# Patient Record
Sex: Female | Born: 1965 | Race: Black or African American | Hispanic: No | Marital: Single | Smoking: Never smoker
Health system: Southern US, Community
[De-identification: ages and names within clinical notes are randomized; demographics above are authoritative.]

## PROBLEM LIST (undated history)

## (undated) DIAGNOSIS — E785 Hyperlipidemia, unspecified: Secondary | ICD-10-CM

## (undated) DIAGNOSIS — F329 Major depressive disorder, single episode, unspecified: Secondary | ICD-10-CM

## (undated) DIAGNOSIS — K219 Gastro-esophageal reflux disease without esophagitis: Secondary | ICD-10-CM

## (undated) DIAGNOSIS — F32A Depression, unspecified: Secondary | ICD-10-CM

## (undated) DIAGNOSIS — E119 Type 2 diabetes mellitus without complications: Secondary | ICD-10-CM

## (undated) DIAGNOSIS — F319 Bipolar disorder, unspecified: Secondary | ICD-10-CM

## (undated) DIAGNOSIS — I10 Essential (primary) hypertension: Secondary | ICD-10-CM

## (undated) DIAGNOSIS — R928 Other abnormal and inconclusive findings on diagnostic imaging of breast: Secondary | ICD-10-CM

## (undated) HISTORY — PX: BREAST BIOPSY: SHX20

## (undated) HISTORY — PX: ABDOMINAL HYSTERECTOMY: SHX81

## (undated) HISTORY — PX: CHOLECYSTECTOMY: SHX55

## (undated) HISTORY — PX: TUBAL LIGATION: SHX77

---

## 2011-06-02 ENCOUNTER — Encounter: Payer: Self-pay | Admitting: Emergency Medicine

## 2011-06-02 ENCOUNTER — Emergency Department (HOSPITAL_COMMUNITY)
Admission: EM | Admit: 2011-06-02 | Discharge: 2011-06-02 | Disposition: A | Discharge status: Home / Self Care | Payer: Medicaid Other | Attending: Emergency Medicine | Admitting: Emergency Medicine

## 2011-06-02 ENCOUNTER — Emergency Department (HOSPITAL_COMMUNITY): Payer: Medicaid Other

## 2011-06-02 DIAGNOSIS — J40 Bronchitis, not specified as acute or chronic: Secondary | ICD-10-CM | POA: Insufficient documentation

## 2011-06-02 DIAGNOSIS — R05 Cough: Secondary | ICD-10-CM | POA: Insufficient documentation

## 2011-06-02 DIAGNOSIS — R059 Cough, unspecified: Secondary | ICD-10-CM | POA: Insufficient documentation

## 2011-06-02 DIAGNOSIS — R509 Fever, unspecified: Secondary | ICD-10-CM | POA: Insufficient documentation

## 2011-06-02 DIAGNOSIS — K5289 Other specified noninfective gastroenteritis and colitis: Secondary | ICD-10-CM | POA: Insufficient documentation

## 2011-06-02 DIAGNOSIS — K529 Noninfective gastroenteritis and colitis, unspecified: Secondary | ICD-10-CM

## 2011-06-02 LAB — CBC
HCT: 39 % (ref 36.0–46.0)
Hemoglobin: 12.8 g/dL (ref 12.0–15.0)
MCHC: 32.8 g/dL (ref 30.0–36.0)
WBC: 8.2 10*3/uL (ref 4.0–10.5)

## 2011-06-02 LAB — COMPREHENSIVE METABOLIC PANEL
AST: 21 U/L (ref 0–37)
Albumin: 3.6 g/dL (ref 3.5–5.2)
Alkaline Phosphatase: 116 U/L (ref 39–117)
BUN: 10 mg/dL (ref 6–23)
CO2: 25 mEq/L (ref 19–32)
Chloride: 100 mEq/L (ref 96–112)
GFR calc non Af Amer: >90 mL/min (ref >90)
Potassium: 3.6 mEq/L (ref 3.5–5.1)
Total Bilirubin: 0.3 mg/dL (ref 0.3–1.2)

## 2011-06-02 LAB — DIFFERENTIAL
Basophils Absolute: 0 10*3/uL (ref 0.0–0.1)
Basophils Relative: 0 % (ref 0–1)
Lymphocytes Relative: 37 % (ref 12–46)
Monocytes Absolute: 0.5 10*3/uL (ref 0.1–1.0)
Monocytes Relative: 6 % (ref 3–12)
Neutro Abs: 4.5 10*3/uL (ref 1.7–7.7)
Neutrophils Relative %: 55 % (ref 43–77)

## 2011-06-02 LAB — URINALYSIS, ROUTINE W REFLEX MICROSCOPIC
Glucose, UA: NEGATIVE mg/dL
Hgb urine dipstick: NEGATIVE
Ketones, ur: NEGATIVE mg/dL
Protein, ur: NEGATIVE mg/dL
Urobilinogen, UA: 0.2 mg/dL (ref 0.0–1.0)

## 2011-06-02 MED ORDER — SODIUM CHLORIDE 0.9 % IV SOLN
Freq: Once | INTRAVENOUS | Status: AC
  Administered 2011-06-02: 14:00:00 via INTRAVENOUS

## 2011-06-02 MED ORDER — ONDANSETRON HCL 4 MG/2ML IJ SOLN
4.0000 mg | Freq: Once | INTRAMUSCULAR | Status: AC
  Administered 2011-06-02: 4 mg via INTRAVENOUS
  Filled 2011-06-02: qty 2

## 2011-06-02 MED ORDER — AZITHROMYCIN 250 MG PO TABS: 250.0000 mg | ORAL_TABLET | Freq: Every day | ORAL | Status: AC

## 2011-06-02 MED ORDER — KETOROLAC TROMETHAMINE 30 MG/ML IJ SOLN
30.0000 mg | Freq: Once | INTRAMUSCULAR | Status: AC
  Administered 2011-06-02: 30 mg via INTRAVENOUS
  Filled 2011-06-02: qty 1

## 2011-06-02 NOTE — ED Provider Notes (Signed)
History     CSN: [id number]  Arrival date & time 06/02/11  1248   First MD Initiated Contact with Patient 06/02/11 1326      Chief Complaint  Patient presents with  . Emesis  . Influenza  . Diarrhea    (Consider location/radiation/quality/duration/timing/severity/associated sxs/prior treatment) Patient is a 45 y.o. female presenting with vomiting, flu symptoms, and diarrhea. The history is provided by the patient.  Emesis  This is a new problem. The current episode started more than 1 week ago. The problem occurs continuously. The problem has been gradually worsening. The emesis has an appearance of stomach contents. The maximum temperature recorded prior to her arrival was 101 to 101.9 F. Associated symptoms include diarrhea.  Influenza  Diarrhea The primary symptoms include vomiting and diarrhea.    History reviewed. No pertinent past medical history.  Past Surgical History  Procedure Date  . Abdominal hysterectomy     No family history on file.  History  Substance Use Topics  . Smoking status: Not on file  . Smokeless tobacco: Not on file  . Alcohol Use: Yes    OB History    Grav Para Term Preterm Abortions TAB SAB Ect Mult Living                  Review of Systems  Gastrointestinal: Positive for vomiting and diarrhea.  All other systems reviewed and are negative.    Allergies  Review of patient's allergies indicates no known allergies.  Home Medications  No current outpatient prescriptions on file.  BP 93/65  Pulse 69  Temp(Src) 98.7 F (37.1 C) (Oral)  Resp 20  SpO2 99%  Physical Exam  Nursing note and vitals reviewed. Constitutional: She is oriented to person, place, and time. She appears well-developed and well-nourished. No distress.  HENT:  Head: Normocephalic and atraumatic.  Neck: Normal range of motion. Neck supple.  Cardiovascular: Normal rate and regular rhythm.  Exam reveals no gallop and no friction rub.   No murmur  heard. Pulmonary/Chest: Effort normal and breath sounds normal. No respiratory distress. She has no wheezes.  Abdominal: Soft. Bowel sounds are normal. She exhibits no distension. There is no tenderness.  Musculoskeletal: Normal range of motion.  Neurological: She is alert and oriented to person, place, and time.  Skin: Skin is warm and dry. She is not diaphoretic.    ED Course  Procedures (including critical care time)  Labs Reviewed - No data to display No results found.   No diagnosis found.    MDM  Labs, Cxr look okay.  Patient has had productive cough for one week.  Will treat with antibiotics.          Geoffery Lyons, MD 06/02/11 [phone]

## 2011-06-02 NOTE — ED Notes (Signed)
Patient verbalized understanding of discharge instruction and prescription

## 2011-06-02 NOTE — ED Notes (Signed)
Pt c/o flu s/s with n/v/d and fever off and on x1.5wks

## 2011-06-21 ENCOUNTER — Emergency Department (HOSPITAL_COMMUNITY)
Admission: EM | Admit: 2011-06-21 | Discharge: 2011-06-21 | Disposition: A | Discharge status: Home / Self Care | Payer: Medicaid Other | Attending: Emergency Medicine | Admitting: Emergency Medicine

## 2011-06-21 ENCOUNTER — Emergency Department (HOSPITAL_COMMUNITY): Payer: Medicaid Other

## 2011-06-21 ENCOUNTER — Other Ambulatory Visit: Payer: Self-pay

## 2011-06-21 ENCOUNTER — Encounter (HOSPITAL_COMMUNITY): Payer: Self-pay | Admitting: *Deleted

## 2011-06-21 DIAGNOSIS — R12 Heartburn: Secondary | ICD-10-CM | POA: Insufficient documentation

## 2011-06-21 DIAGNOSIS — R1013 Epigastric pain: Secondary | ICD-10-CM | POA: Insufficient documentation

## 2011-06-21 DIAGNOSIS — K219 Gastro-esophageal reflux disease without esophagitis: Secondary | ICD-10-CM | POA: Insufficient documentation

## 2011-06-21 DIAGNOSIS — Z79899 Other long term (current) drug therapy: Secondary | ICD-10-CM | POA: Insufficient documentation

## 2011-06-21 HISTORY — DX: Gastro-esophageal reflux disease without esophagitis: K21.9

## 2011-06-21 LAB — CBC
HCT: 38.1 % (ref 36.0–46.0)
MCH: 26.4 pg (ref 26.0–34.0)
MCHC: 33.1 g/dL (ref 30.0–36.0)
MCV: 79.9 fL (ref 78.0–100.0)
RDW: 13.6 % (ref 11.5–15.5)

## 2011-06-21 LAB — POCT I-STAT, CHEM 8
Calcium, Ion: 1.12 mmol/L (ref 1.12–1.32)
Chloride: 106 mEq/L (ref 96–112)
HCT: 38 % (ref 36.0–46.0)
Sodium: 140 mEq/L (ref 135–145)

## 2011-06-21 LAB — D-DIMER, QUANTITATIVE: D-Dimer, Quant: 1.43 ug/mL-FEU — ABNORMAL HIGH (ref 0.00–0.48)

## 2011-06-21 LAB — COMPREHENSIVE METABOLIC PANEL
Albumin: 3.4 g/dL — ABNORMAL LOW (ref 3.5–5.2)
BUN: 12 mg/dL (ref 6–23)
Calcium: 9.4 mg/dL (ref 8.4–10.5)
Creatinine, Ser: 0.7 mg/dL (ref 0.50–1.10)
Total Bilirubin: 0.2 mg/dL — ABNORMAL LOW (ref 0.3–1.2)
Total Protein: 7.9 g/dL (ref 6.0–8.3)

## 2011-06-21 LAB — POCT I-STAT TROPONIN I: Troponin i, poc: 0 ng/mL (ref 0.00–0.08)

## 2011-06-21 LAB — TROPONIN I: Troponin I: <0.3 ng/mL (ref <0.30)

## 2011-06-21 MED ORDER — FAMOTIDINE 20 MG PO TABS: 20.0000 mg | ORAL_TABLET | Freq: Two times a day (BID) | ORAL | Status: DC

## 2011-06-21 MED ORDER — FAMOTIDINE IN NACL 20-0.9 MG/50ML-% IV SOLN
20.0000 mg | Freq: Once | INTRAVENOUS | Status: AC
  Administered 2011-06-21: 20 mg via INTRAVENOUS
  Filled 2011-06-21: qty 50

## 2011-06-21 MED ORDER — GI COCKTAIL ~~LOC~~
30.0000 mL | Freq: Once | ORAL | Status: AC
  Administered 2011-06-21: 30 mL via ORAL
  Filled 2011-06-21: qty 30

## 2011-06-21 MED ORDER — IOHEXOL 300 MG/ML  SOLN
100.0000 mL | Freq: Once | INTRAMUSCULAR | Status: AC | PRN
  Administered 2011-06-21: 100 mL via INTRAVENOUS

## 2011-06-21 MED ORDER — PANTOPRAZOLE SODIUM 40 MG IV SOLR
40.0000 mg | Freq: Once | INTRAVENOUS | Status: AC
  Administered 2011-06-21: 40 mg via INTRAVENOUS
  Filled 2011-06-21: qty 40

## 2011-06-21 MED ORDER — SODIUM CHLORIDE 0.9 % IV BOLUS (SEPSIS)
1000.0000 mL | Freq: Once | INTRAVENOUS | Status: AC
  Administered 2011-06-21: 1000 mL via INTRAVENOUS

## 2011-06-21 MED ORDER — ESOMEPRAZOLE MAGNESIUM 40 MG PO CPDR: 40.0000 mg | DELAYED_RELEASE_CAPSULE | Freq: Every day | ORAL | Status: DC

## 2011-06-21 NOTE — ED Notes (Addendum)
Sunnie Nielsen, MD at bedside.

## 2011-06-21 NOTE — ED Notes (Addendum)
Per EMS- pt in c/o epigastric pain x1 day, worse after eating dinner tonight, intermittent, pt is out of nexium

## 2011-06-21 NOTE — ED Provider Notes (Signed)
History     CSN: [id number]  Arrival date & time 06/21/11  0044   First MD Initiated Contact with Patient 06/21/11 0054      Chief Complaint  Patient presents with  . Abdominal Pain    (Consider location/radiation/quality/duration/timing/severity/associated sxs/prior treatment) Patient is a 46 y.o. female presenting with abdominal pain. The history is provided by the patient.  Abdominal Pain The primary symptoms of the illness do not include fever, shortness of breath or dysuria. The current episode started 13 to 24 hours ago. The onset of the illness was gradual. The problem has not changed since onset. Associated with: Started after eating lasagna. The patient has not had a change in bowel habit. Risk factors: Has a history of GERD and ran out of her medication that she takes for the same. Additional symptoms associated with the illness include heartburn. Symptoms associated with the illness do not include chills, anorexia, diaphoresis, constipation, urgency, hematuria, frequency or back pain. Significant associated medical issues do not include liver disease or cardiac disease. Associated medical issues comments: History of Cholecystectomy.   pain is epigastric and substernal, described as burning in quality. No radiation of pain. Feels like reflux with history of same. Patient states she is never a lasagna before. She has not had any medications at home tonight. Symptoms are moderate in severity. No associated shortness of breath, diaphoresis or back pain. No leg pain or swelling. No cough or fevers.  Past Medical History  Diagnosis Date  . GERD (gastroesophageal reflux disease)     Past Surgical History  Procedure Date  . Abdominal hysterectomy     History reviewed. No pertinent family history.  History  Substance Use Topics  . Smoking status: Not on file  . Smokeless tobacco: Not on file  . Alcohol Use: Yes    OB History    Grav Para Term Preterm Abortions TAB SAB Ect  Mult Living                  Review of Systems  Constitutional: Negative for fever, chills and diaphoresis.  HENT: Negative for neck pain and neck stiffness.   Eyes: Negative for pain.  Respiratory: Negative for shortness of breath.   Cardiovascular: Negative for leg swelling.  Gastrointestinal: Positive for heartburn. Negative for constipation, blood in stool, abdominal distention and anorexia.  Genitourinary: Negative for dysuria, urgency, frequency and hematuria.  Musculoskeletal: Negative for back pain.  Skin: Negative for rash.  Neurological: Negative for headaches.  All other systems reviewed and are negative.    Allergies  Review of patient's allergies indicates no known allergies.  Home Medications   Current Outpatient Rx  Name Route Sig Dispense Refill  . DIVALPROEX SODIUM 250 MG PO TBEC Oral Take 500 mg by mouth every morning.     . MULTI-VITAMIN/MINERALS PO TABS Oral Take 1 tablet by mouth daily.      . QUETIAPINE FUMARATE 400 MG PO TABS Oral Take 400 mg by mouth at bedtime.      . TRAZODONE HCL 100 MG PO TABS Oral Take 100 mg by mouth at bedtime.      Marland Kitchen ZOLPIDEM TARTRATE 10 MG PO TABS Oral Take 10 mg by mouth at bedtime as needed. sleep       BP 101/48  Pulse 82  Temp(Src) 98 F (36.7 C) (Oral)  Resp 17  SpO2 99%  Physical Exam  Constitutional: She is oriented to person, place, and time. She appears well-developed and well-nourished.  HENT:  Head: Normocephalic and atraumatic.  Eyes: Conjunctivae and EOM are normal. Pupils are equal, round, and reactive to light.  Neck: Trachea normal. Neck supple. No thyromegaly present.  Cardiovascular: Normal rate, regular rhythm, S1 normal, S2 normal and normal pulses.     No systolic murmur is present   No diastolic murmur is present  Pulses:      Radial pulses are 2+ on the right side, and 2+ on the left side.  Pulmonary/Chest: Effort normal and breath sounds normal. She has no wheezes. She has no rhonchi. She  has no rales. She exhibits no tenderness.  Abdominal: Soft. Normal appearance and bowel sounds are normal. There is no tenderness. There is no CVA tenderness and negative Murphy's sign.  Musculoskeletal:       BLE:s Calves nontender, no cords or erythema, negative Homans sign  Neurological: She is alert and oriented to person, place, and time. She has normal strength. No cranial nerve deficit or sensory deficit. GCS eye subscore is 4. GCS verbal subscore is 5. GCS motor subscore is 6.  Skin: Skin is warm and dry. No rash noted. She is not diaphoretic.  Psychiatric: Her speech is normal.       Cooperative and appropriate    ED Course  Procedures (including critical care time)  Results for orders placed during the hospital encounter of 06/21/11  CBC      Component Value Range   WBC 9.2  4.0 - 10.5 (K/uL)   RBC 4.77  3.87 - 5.11 (MIL/uL)   Hemoglobin 12.6  12.0 - 15.0 (g/dL)   HCT 19.1  47.8 - 29.5 (%)   MCV 79.9  78.0 - 100.0 (fL)   MCH 26.4  26.0 - 34.0 (pg)   MCHC 33.1  30.0 - 36.0 (g/dL)   RDW 62.1  30.8 - 65.7 (%)   Platelets 296  150 - 400 (K/uL)  COMPREHENSIVE METABOLIC PANEL      Component Value Range   Sodium 134 (*) 135 - 145 (mEq/L)   Potassium 3.4 (*) 3.5 - 5.1 (mEq/L)   Chloride 98  96 - 112 (mEq/L)   CO2 25  19 - 32 (mEq/L)   Glucose, Bld 155 (*) 70 - 99 (mg/dL)   BUN 12  6 - 23 (mg/dL)   Creatinine, Ser 8.46  0.50 - 1.10 (mg/dL)   Calcium 9.4  8.4 - 96.2 (mg/dL)   Total Protein 7.9  6.0 - 8.3 (g/dL)   Albumin 3.4 (*) 3.5 - 5.2 (g/dL)   AST 26  0 - 37 (U/L)   ALT 42 (*) 0 - 35 (U/L)   Alkaline Phosphatase 97  39 - 117 (U/L)   Total Bilirubin 0.2 (*) 0.3 - 1.2 (mg/dL)   GFR calc non Af Amer >90  >90 (mL/min)   GFR calc Af Amer >90  >90 (mL/min)  TROPONIN I      Component Value Range   Troponin I <0.30  <0.30 (ng/mL)  D-DIMER, QUANTITATIVE      Component Value Range   D-Dimer, Quant 1.43 (*) 0.00 - 0.48 (ug/mL-FEU)  POCT I-STAT, CHEM 8      Component Value  Range   Sodium 140  135 - 145 (mEq/L)   Potassium 4.0  3.5 - 5.1 (mEq/L)   Chloride 106  96 - 112 (mEq/L)   BUN 9  6 - 23 (mg/dL)   Creatinine, Ser 9.52  0.50 - 1.10 (mg/dL)   Glucose, Bld 93  70 - 99 (mg/dL)  Calcium, Ion 1.12  1.12 - 1.32 (mmol/L)   TCO2 23  0 - 100 (mmol/L)   Hemoglobin 12.9  12.0 - 15.0 (g/dL)   HCT 72.5  36.6 - 44.0 (%)  POCT I-STAT TROPONIN I      Component Value Range   Troponin i, poc 0.00  0.00 - 0.08 (ng/mL)   Comment 3            Ct Angio Chest W/cm &/or Wo Cm  06/21/2011  *RADIOLOGY REPORT*  Clinical Data: Worsening epigastric abdominal pain, chest pain, nausea and vomiting.  Elevated D-dimer.  CT ANGIOGRAPHY CHEST  Technique:  Multidetector CT imaging of the chest using the standard protocol during bolus administration of intravenous contrast. Multiplanar reconstructed images including MIPs were obtained and reviewed to evaluate the vascular anatomy.  Contrast: OMNIPAQUE IOHEXOL 300 MG/ML IV SOLN  Comparison: Chest radiograph performed earlier today at 01:05 a.m.  Findings: There is no evidence of significant pulmonary embolus. Evaluation for pulmonary embolus is suboptimal due to limitations in the timing of the contrast bolus.  Minimal bilateral dependent subsegmental atelectasis is noted.  A small nodule along the right major fissure likely reflects a normal lymph node.  A few blebs are seen at the medial right lung apex. There is no evidence of significant focal consolidation, pleural effusion or pneumothorax.  No masses are identified; no abnormal focal contrast enhancement is seen.  The mediastinum is unremarkable in appearance.  There is no evidence of mediastinal lymphadenopathy.  No pericardial effusion is seen.  The great vessels are unremarkable in appearance.  No axillary lymphadenopathy is seen.  The visualized portions of the thyroid gland are unremarkable in appearance.  The visualized portions of the liver and spleen are unremarkable.  No acute  osseous abnormalities are seen.  IMPRESSION:  1.  No evidence of significant pulmonary embolus. 2.  Minimal bilateral dependent subsegmental atelectasis noted; few blebs seen at the right lung apex.  Lungs otherwise clear.  Original Report Authenticated By: Tonia Ghent, M.D.   Dg Chest Portable 1 View  06/21/2011  *RADIOLOGY REPORT*  Clinical Data: Upper abdominal pain  PORTABLE CHEST - 1 VIEW  Comparison: 10/01/2010  Findings: Normal heart size and vascularity.  Minimal left lower lobe atelectasis / scarring.  Negative for pneumonia, edema, effusion or pneumothorax.  Trachea midline.  IMPRESSION: No acute chest finding.  Stable exam.  Original Report Authenticated By: Judie Petit. Ruel Favors, M.D.      Date: 06/21/2011  Rate: 89  Rhythm: normal sinus rhythm  QRS Axis: normal  Intervals: normal  ST/T Wave abnormalities: nonspecific ST/T changes  Conduction Disutrbances:none  Narrative Interpretation:   Old EKG Reviewed: none available    Recheck at 3:20 AM symptoms resolved after medications provided. For elevated d-dimer CAT scan was obtained and reviewed as above. No PE. Serial cardiac enzymes obtained. EKG reviewed.   MDM   Reflux symptoms improved with GI cocktail, Pepcid and Protonix. Workup as above including serial cardiac enzymes within normal limits. Nexium prescription provided with plan close followup and primary care office. Patient states understanding strict return precautions and is stable for discharge home.        Sunnie Nielsen, MD 06/21/11 [phone]

## 2011-06-21 NOTE — ED Notes (Signed)
Pt reports burning in her throat and epigastric pain x3 days progressively worse - pt also w/ n/v x5 episodes x1 hr. Pt in no acute distress on assessment, denies any shortness of breath. Admits to hx of GERD and has been unable to get her antacid rx x1 month.

## 2013-01-05 IMAGING — CT CT ANGIO CHEST
1 of 2 series · 19 of 31 positions shown · IV contrast (100 ML OMNI 300)
Comparison: Chest radiograph performed earlier today at [DATE] a.m.

CLINICAL DATA: Worsening epigastric abdominal pain, chest pain,
nausea and vomiting.  Elevated D-dimer.

CT ANGIOGRAPHY CHEST
TECHNIQUE: Multidetector CT imaging of the chest using the
standard protocol during bolus administration of intravenous
contrast. Multiplanar reconstructed images including MIPs were
obtained and reviewed to evaluate the vascular anatomy.
Contrast: 100mL OMNIPAQUE IOHEXOL 300 MG/ML IV SOLN

[Series 7: thins for pacs · axial · 0.54mm/px · z∈[+1607,+1780]mm · 19 of 193 slices shown]
[im 10/193  lung]
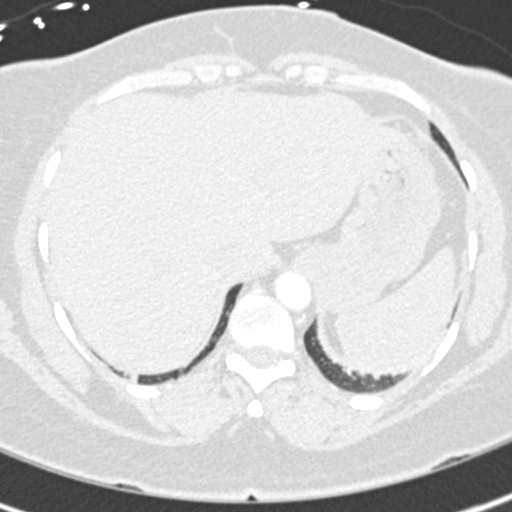
[im 20/193  mediastinal]
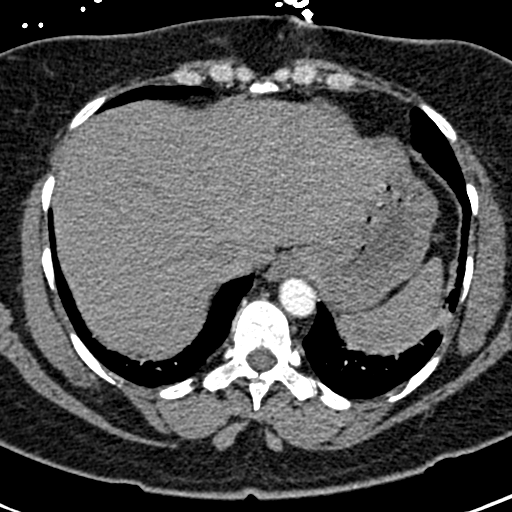
[im 29/193  lung]
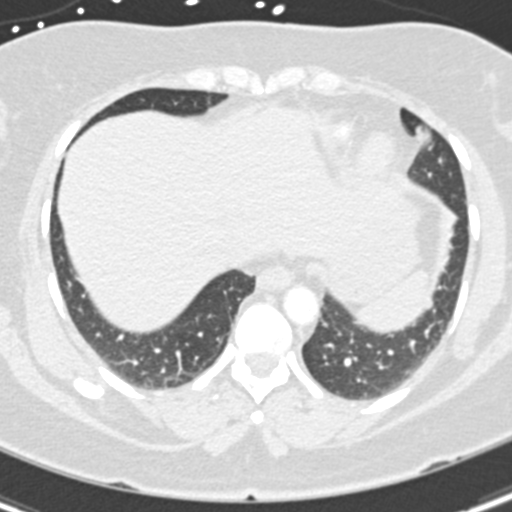
[im 39/193  mediastinal]
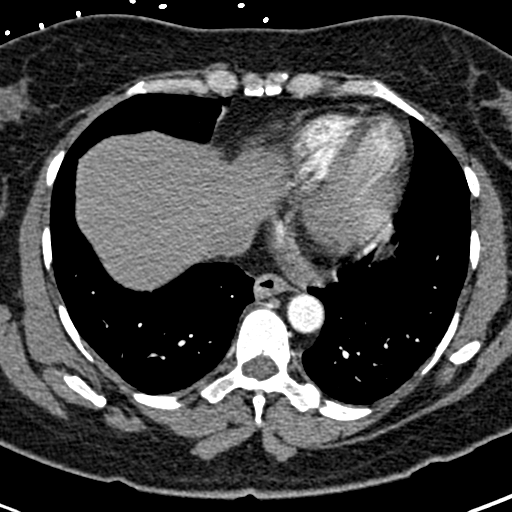
[im 49/193  lung]
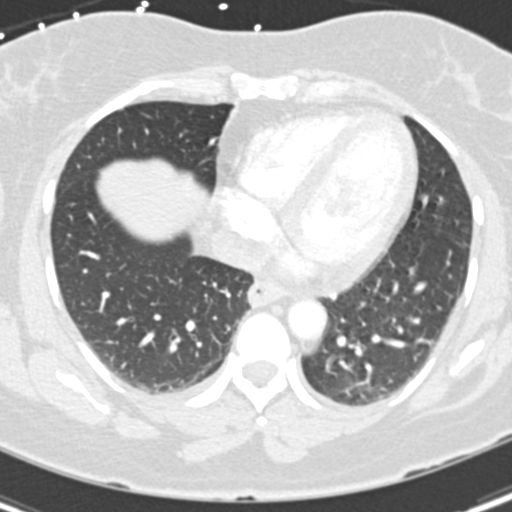
[im 65/193  mediastinal]
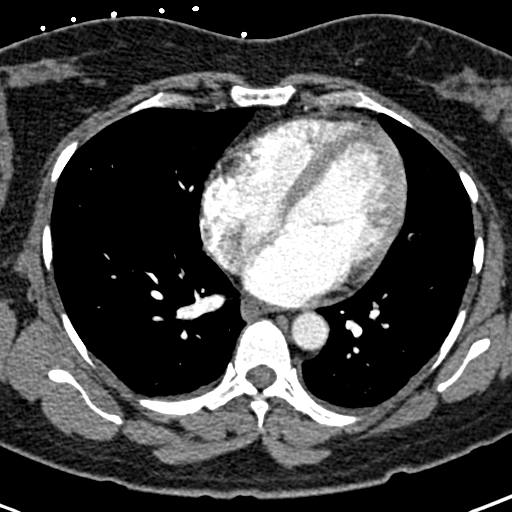
[im 68/193  lung]
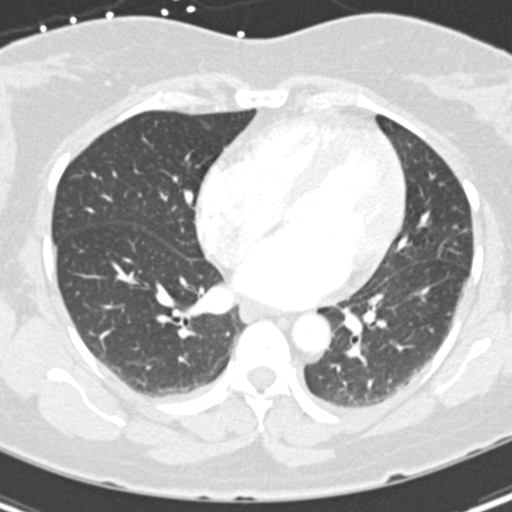
[im 77/193  mediastinal]
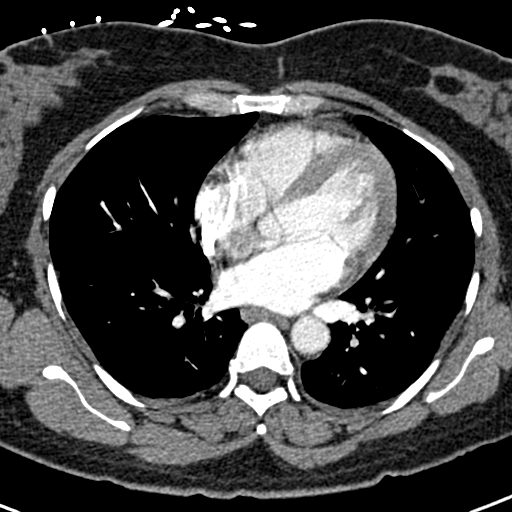
[im 87/193  lung]
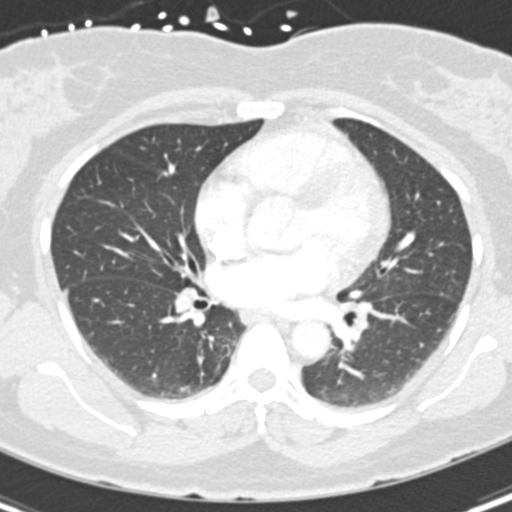
[im 97/193  mediastinal]
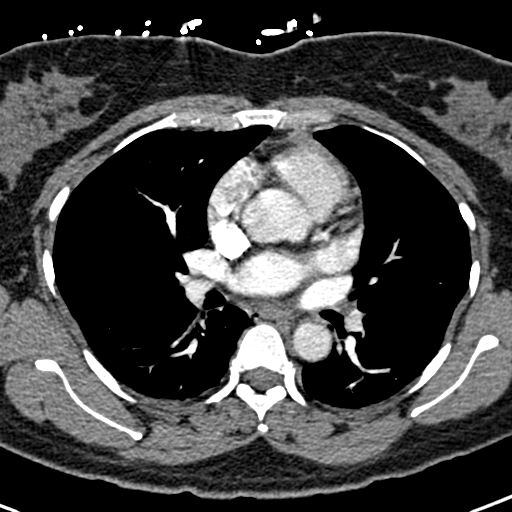
[im 106/193  lung]
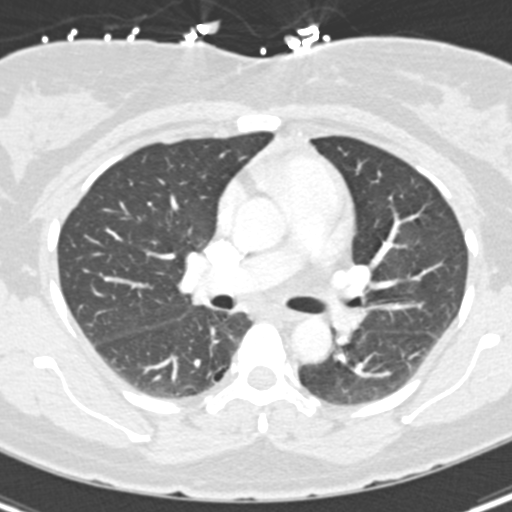
[im 116/193  mediastinal]
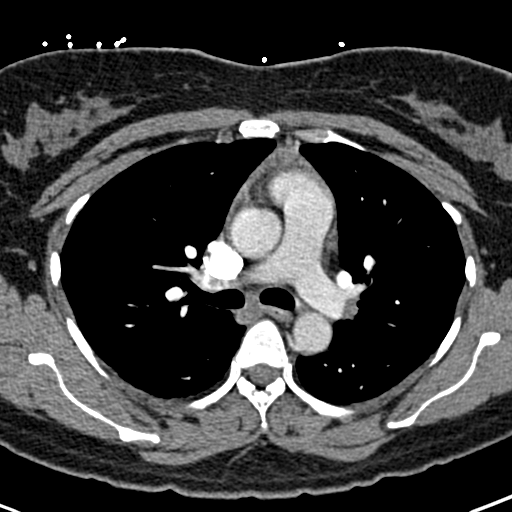
[im 125/193  lung]
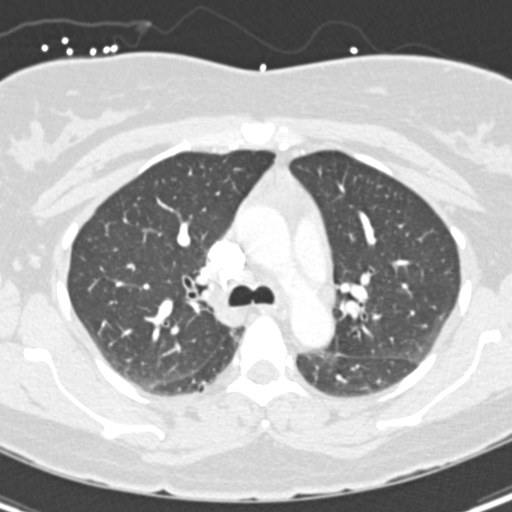
[im 129/193  mediastinal]
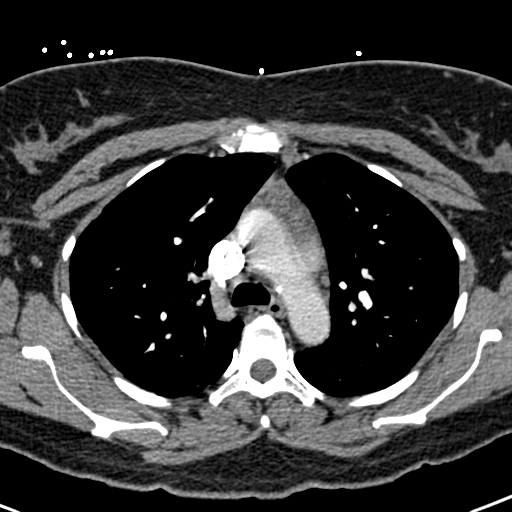
[im 145/193  lung]
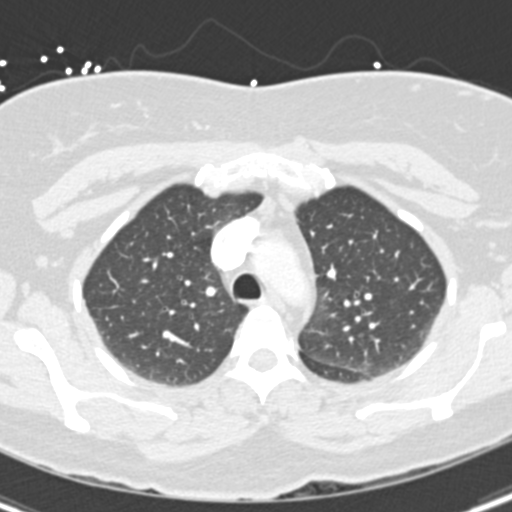
[im 154/193  mediastinal]
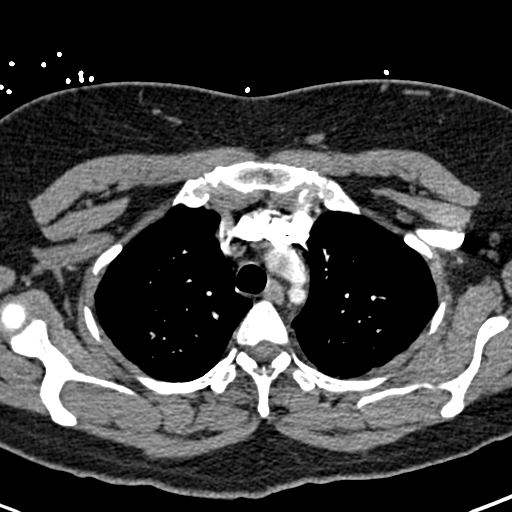
[im 164/193  lung]
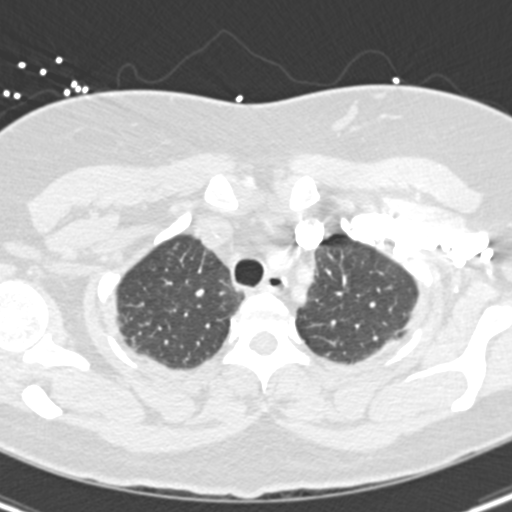
[im 173/193  mediastinal]
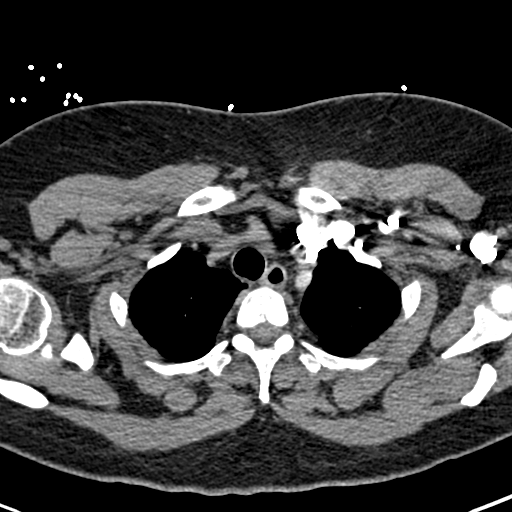
[im 183/193  lung]
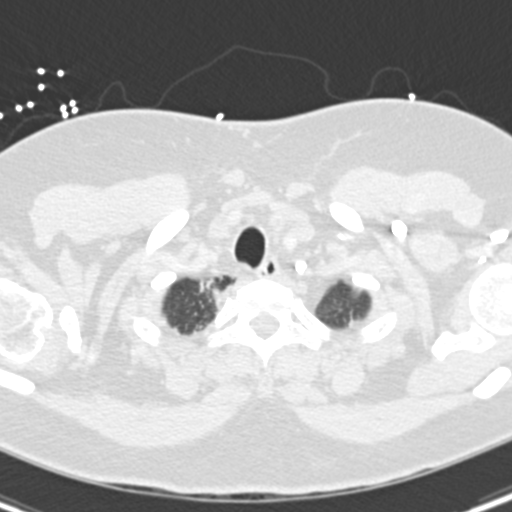

[19 of 31 positions shown; findings below may reference images not displayed]

FINDINGS: There is no evidence of significant pulmonary embolus.
Evaluation for pulmonary embolus is suboptimal due to limitations
in the timing of the contrast bolus.

Minimal bilateral dependent subsegmental atelectasis is noted.  A
small nodule along the right major fissure likely reflects a normal
lymph node.  A few blebs are seen at the medial right lung apex.
There is no evidence of significant focal consolidation, pleural
effusion or pneumothorax.  No masses are identified; no abnormal
focal contrast enhancement is seen.

The mediastinum is unremarkable in appearance.  There is no
evidence of mediastinal lymphadenopathy.  No pericardial effusion
is seen.  The great vessels are unremarkable in appearance.  No
axillary lymphadenopathy is seen.  The visualized portions of the
thyroid gland are unremarkable in appearance.

The visualized portions of the liver and spleen are unremarkable.

No acute osseous abnormalities are seen.
IMPRESSION: 1.  No evidence of significant pulmonary embolus.
2.  Minimal bilateral dependent subsegmental atelectasis noted; few
blebs seen at the right lung apex.  Lungs otherwise clear.

## 2013-03-12 ENCOUNTER — Emergency Department (HOSPITAL_COMMUNITY)
Admission: EM | Admit: 2013-03-12 | Discharge: 2013-03-13 | Disposition: A | Discharge status: Home / Self Care | Payer: Medicaid - Out of State | Attending: Emergency Medicine | Admitting: Emergency Medicine

## 2013-03-12 ENCOUNTER — Encounter (HOSPITAL_COMMUNITY): Payer: Self-pay | Admitting: Emergency Medicine

## 2013-03-12 ENCOUNTER — Emergency Department (HOSPITAL_COMMUNITY): Payer: Medicaid - Out of State

## 2013-03-12 DIAGNOSIS — R630 Anorexia: Secondary | ICD-10-CM | POA: Insufficient documentation

## 2013-03-12 DIAGNOSIS — J069 Acute upper respiratory infection, unspecified: Secondary | ICD-10-CM | POA: Insufficient documentation

## 2013-03-12 DIAGNOSIS — Z79899 Other long term (current) drug therapy: Secondary | ICD-10-CM | POA: Insufficient documentation

## 2013-03-12 DIAGNOSIS — R109 Unspecified abdominal pain: Secondary | ICD-10-CM | POA: Insufficient documentation

## 2013-03-12 DIAGNOSIS — Z87891 Personal history of nicotine dependence: Secondary | ICD-10-CM | POA: Insufficient documentation

## 2013-03-12 DIAGNOSIS — R000 Tachycardia, unspecified: Secondary | ICD-10-CM | POA: Insufficient documentation

## 2013-03-12 DIAGNOSIS — K219 Gastro-esophageal reflux disease without esophagitis: Secondary | ICD-10-CM | POA: Insufficient documentation

## 2013-03-12 LAB — CBC WITH DIFFERENTIAL/PLATELET
Eosinophils Absolute: 0.1 10*3/uL (ref 0.0–0.7)
Eosinophils Relative: 1 % (ref 0–5)
HCT: 39.8 % (ref 36.0–46.0)
Hemoglobin: 13.1 g/dL (ref 12.0–15.0)
Lymphs Abs: 2.3 10*3/uL (ref 0.7–4.0)
MCH: 26.2 pg (ref 26.0–34.0)
MCV: 79.6 fL (ref 78.0–100.0)
Monocytes Absolute: 0.9 10*3/uL (ref 0.1–1.0)
Monocytes Relative: 8 % (ref 3–12)
RBC: 5 MIL/uL (ref 3.87–5.11)

## 2013-03-12 MED ORDER — ACETAMINOPHEN 500 MG PO TABS
1000.0000 mg | ORAL_TABLET | Freq: Once | ORAL | Status: AC
  Administered 2013-03-13: 1000 mg via ORAL
  Filled 2013-03-12: qty 2

## 2013-03-12 MED ORDER — SODIUM CHLORIDE 0.9 % IV BOLUS (SEPSIS)
1000.0000 mL | Freq: Once | INTRAVENOUS | Status: AC
  Administered 2013-03-12: 1000 mL via INTRAVENOUS

## 2013-03-12 NOTE — ED Notes (Signed)
Pt c/o fever up to 103 at home, midline upper abd pain x 3 days. Denies n/v/d. +cough. Pt recently arrived in the area from [place] 7 days ago.

## 2013-03-12 NOTE — ED Provider Notes (Signed)
CSN: [id number]     Arrival date & time 03/12/13  1959 History   First MD Initiated Contact with Patient 03/12/13 2302     Chief Complaint  Patient presents with  . Fever  . Abdominal Pain    HPI  Patient presents with concern of cough, fever, anorexia. Symptoms began approximately 3 days ago without clear precipitant. Since onset symptoms have been persistent.  She has not taken any new medication for relief. She had abdominal pain previously, but none on my evaluation. She denies confusion, disorientation, dyspnea, chest pain.  The patient was recently hospitalized in [place] for gastritis. Patient removed here to [facility] one week ago.     Past Medical History  Diagnosis Date  . GERD (gastroesophageal reflux disease)    Past Surgical History  Procedure Laterality Date  . Abdominal hysterectomy    . Cholecystectomy    . Tubal ligation     No family history on file. History  Substance Use Topics  . Smoking status: Former Games developer  . Smokeless tobacco: Not on file  . Alcohol Use: Yes     Comment: rarely   OB History   Grav Para Term Preterm Abortions TAB SAB Ect Mult Living                 Review of Systems  Constitutional:       Per HPI, otherwise negative  HENT:       Per HPI, otherwise negative  Respiratory:       Per HPI, otherwise negative  Cardiovascular:       Per HPI, otherwise negative  Gastrointestinal: Negative for vomiting.  Endocrine:       Negative aside from HPI  Genitourinary:       Neg aside from HPI   Musculoskeletal:       Per HPI, otherwise negative  Skin: Negative.   Neurological: Negative for syncope.    Allergies  Review of patient's allergies indicates no known allergies.  Home Medications   Current Outpatient Rx  Name  Route  Sig  Dispense  Refill  . divalproex (DEPAKOTE) 250 MG DR tablet   Oral   Take 500 mg by mouth every morning.          Marland Kitchen esomeprazole (NEXIUM) 40 MG capsule   Oral   Take 1 capsule (40 mg  total) by mouth daily.   30 capsule   0   . naproxen (NAPROSYN) 375 MG tablet   Oral   Take 375 mg by mouth 2 (two) times daily with a meal.         . QUEtiapine (SEROQUEL) 400 MG tablet   Oral   Take 400 mg by mouth at bedtime.           Marland Kitchen EXPIRED: famotidine (PEPCID) 20 MG tablet   Oral   Take 1 tablet (20 mg total) by mouth 2 (two) times daily.   30 tablet   0    BP 126/76  Pulse 101  Temp(Src) 100.4 F (38 C) (Oral)  Resp 18  Ht 5\' 5"  (1.651 m)  Wt 186 lb (84.369 kg)  BMI 30.95 kg/m2  SpO2 96% Physical Exam  Nursing note and vitals reviewed. Constitutional: She is oriented to person, place, and time. She appears well-developed and well-nourished. No distress.  HENT:  Head: Normocephalic and atraumatic.  Eyes: Conjunctivae and EOM are normal.  Cardiovascular: Regular rhythm.  Tachycardia present.   Pulmonary/Chest: Effort normal and breath sounds normal.  No stridor. No respiratory distress.  Abdominal: She exhibits no distension.  Musculoskeletal: She exhibits no edema.  Neurological: She is alert and oriented to person, place, and time. No cranial nerve deficit.  Skin: Skin is warm and dry.  Psychiatric: She has a normal mood and affect.    ED Course  Procedures (including critical care time) Labs Review Labs Reviewed  CBC WITH DIFFERENTIAL  COMPREHENSIVE METABOLIC PANEL  LACTIC ACID, PLASMA   Imaging Review Dg Chest 2 View  03/12/2013   CLINICAL DATA:  Chest pain and shortness of breath  EXAM: CHEST  2 VIEW  COMPARISON:  06/21/2011  FINDINGS: The cardiac shadow is within normal limits. The lungs are well aerated bilaterally. A tiny density is noted in the right lung apex. When compare with the previous CT examination there is a small calcification in this region. Followup in 3-6 months to assess for stability is recommended. No other focal abnormality is seen.  IMPRESSION: No acute abnormality noted.  Tiny density in the right upper lobe as described.  This corresponds to a calcifications seen on the prior CT examination. Short-term followup is recommended to assess for stability.   Electronically Signed   By: Alcide Clever M.D.   On: 03/12/2013 21:30   Pulse oximetry 100% room air normal  2:01 AM Patient in no distress on repeat exam. She was made aware of all results.  MDM  No diagnosis found.  Patient presents with concern of ongoing cough, mild fever, and on initial exam is awake and alert, with a low-grade temperature.  Patient's labs are notable for mild leukocytosis, mild suggestion of possible urinary tract infection.  Patient has no urinary complaints, and urine culture is pending. With patient's evaluation, there is concern for viral infection, for which she will receive antitussive medication.  She was also provided resources to obtain a new primary care physician here locally.    Gerhard Munch, MD 03/13/13 [phone]

## 2013-03-13 LAB — COMPREHENSIVE METABOLIC PANEL
BUN: 9 mg/dL (ref 6–23)
Calcium: 9.3 mg/dL (ref 8.4–10.5)
GFR calc Af Amer: 89 mL/min — ABNORMAL LOW (ref >90)
GFR calc non Af Amer: 77 mL/min — ABNORMAL LOW (ref >90)
Glucose, Bld: 98 mg/dL (ref 70–99)
Total Protein: 8.6 g/dL — ABNORMAL HIGH (ref 6.0–8.3)

## 2013-03-13 LAB — URINALYSIS, ROUTINE W REFLEX MICROSCOPIC
Bilirubin Urine: NEGATIVE
Nitrite: NEGATIVE
Protein, ur: NEGATIVE mg/dL
Specific Gravity, Urine: 1.023 (ref 1.005–1.030)
Urobilinogen, UA: 0.2 mg/dL (ref 0.0–1.0)

## 2013-03-13 LAB — URINE MICROSCOPIC-ADD ON

## 2013-03-13 LAB — LACTIC ACID, PLASMA: Lactic Acid, Venous: 1.1 mmol/L (ref 0.5–2.2)

## 2013-03-13 MED ORDER — HYDROCODONE-ACETAMINOPHEN 7.5-325 MG/15ML PO SOLN: 15.0000 mL | Freq: Four times a day (QID) | ORAL | Status: DC | PRN

## 2013-08-05 ENCOUNTER — Ambulatory Visit: Payer: Self-pay | Admitting: Advanced Practice Midwife

## 2013-09-09 ENCOUNTER — Encounter: Payer: Self-pay | Admitting: Advanced Practice Midwife

## 2013-09-09 ENCOUNTER — Ambulatory Visit (INDEPENDENT_AMBULATORY_CARE_PROVIDER_SITE_OTHER): Payer: Medicaid Other | Admitting: Advanced Practice Midwife

## 2013-09-09 VITALS — BP 148/84 | HR 71 | Temp 97.4°F | Ht 64.0 in | Wt 196.0 lb

## 2013-09-09 DIAGNOSIS — Z8742 Personal history of other diseases of the female genital tract: Secondary | ICD-10-CM

## 2013-09-09 DIAGNOSIS — Z833 Family history of diabetes mellitus: Secondary | ICD-10-CM

## 2013-09-09 DIAGNOSIS — N939 Abnormal uterine and vaginal bleeding, unspecified: Secondary | ICD-10-CM

## 2013-09-09 DIAGNOSIS — R635 Abnormal weight gain: Secondary | ICD-10-CM

## 2013-09-09 DIAGNOSIS — N644 Mastodynia: Secondary | ICD-10-CM

## 2013-09-09 DIAGNOSIS — I10 Essential (primary) hypertension: Secondary | ICD-10-CM

## 2013-09-09 DIAGNOSIS — Z0000 Encounter for general adult medical examination without abnormal findings: Secondary | ICD-10-CM

## 2013-09-09 DIAGNOSIS — N898 Other specified noninflammatory disorders of vagina: Secondary | ICD-10-CM

## 2013-09-09 LAB — TSH: TSH: 1.407 u[IU]/mL (ref 0.350–4.500)

## 2013-09-09 LAB — HEMOGLOBIN A1C
Hgb A1c MFr Bld: 6.5 % — ABNORMAL HIGH (ref <5.7)
MEAN PLASMA GLUCOSE: 140 mg/dL — AB (ref <117)

## 2013-09-09 NOTE — Progress Notes (Signed)
Subjective:     Florestine AversKaren Karam is a 48 y.o. female here for a routine exam.  Current complaints: Patient is in the office today for an Annual Exam.  Patient states she had a cycle in December and January both lasting for 5 days, Patient states the cycles were light but still what she would consider to be her normal cycle. Patient states she has had a Hysterectomy. Patient states she is now taking a different medication for Nexium but she is unsure of the name of it.  The HPI was reviewed and explored in further detail by the provider.  Patient reports she had a partial hysterectomy per her request. States she recently has had a 60 lb weight gain in the past 8 months. She has been on seroquel that makes her constantly eat. She states she has recently had vaginal bleeding as mentioned above. Denies pain, denies bloating or other assoc symptoms.   Patient reports breast pain in left breast on exam. States in [place] she was told she had cyst that needed to be followed every 6 months, she has not yet had f/u.  Patient needs to est. Care w/ PCP.  Gynecologic History Patient's last menstrual period was 06/05/2013. Contraception: status post hysterectomy Last Pap: 2014. Results were: abnormal ( Patient states they were always inconclusive)  Last mammogram: 2014. Results were: Patient states she was to be monitored every 6 months for cyst and clusters but hasn't been checked in a year because she moved.   Obstetric History OB History  Gravida Para Term Preterm AB SAB TAB Ectopic Multiple Living  4 3 3  1 1    3     # Outcome Date GA Lbr Len/2nd Weight Sex Delivery Anes PTL Lv  4 TRM 02/17/88 3659w0d  7 lb 8 oz (3.402 kg) M SVD EPI  Y  3 TRM 11/20/86 7159w0d  8 lb 7 oz (3.827 kg) M SVD None  Y  2 SAB 1987        N  1 TRM 09/20/83 2959w0d  9 lb 14 oz (4.479 kg) M SVD None  Y     Past Medical History  Diagnosis Date  . GERD (gastroesophageal reflux disease)    Family History  Problem Relation Age of  Onset  . Hypertension Mother   . Bipolar disorder Mother   Patient reports extended family and siblings + for DM    The following portions of the patient's history were reviewed and updated as appropriate: allergies, current medications, past family history, past medical history, past social history, past surgical history and problem list.  Review of Systems Pertinent items are noted in HPI.    Objective:    BP 148/84  Pulse 71  Temp(Src) 97.4 F (36.3 C)  Ht 5\' 4"  (1.626 m)  Wt 196 lb (88.905 kg)  BMI 33.63 kg/m2  LMP 06/05/2013  General Appearance:    Alert, cooperative, no distress, appears stated age  Head:    Normocephalic, without obvious abnormality, atraumatic  Eyes:    PERRL, conjunctiva/corneas clear, EOM's intact, fundi    benign, both eyes  Ears:    Normal TM's and external ear canals, both ears  Nose:   Nares normal, septum midline, mucosa normal, no drainage    or sinus tenderness  Throat:   Lips, mucosa, and tongue normal; teeth and gums normal  Neck:   Supple, symmetrical, trachea midline, no adenopathy;    thyroid:  no enlargement/tenderness/nodules; no carotid   bruit or  JVD  Back:     Symmetric, no curvature, ROM normal, no CVA tenderness  Lungs:     Clear to auscultation bilaterally, respirations unlabored  Chest Wall:    No tenderness or deformity   Heart:    Regular rate and rhythm, S1 and S2 normal, no murmur, rub   or gallop  Breast Exam:    Tenderness in left breast on exam mainly between 3-6 oclock, marked tenderness, masses, or nipple abnormality  Abdomen:     Soft, non-tender, bowel sounds active all four quadrants,    no masses, no organomegaly  Genitalia:    Normal female without lesion, discharge or tenderness, Cervix visualized/friable. Unable to palpate uterus or adnexa w/ bimanual exam, most likely absent.     Extremities:   Extremities normal, atraumatic, no cyanosis or edema  Pulses:   2+ and symmetric all extremities  Skin:   Skin  color, texture, turgor normal, no rashes or lesions  Lymph nodes:   Cervical, supraclavicular, and axillary nodes normal  Neurologic:   CNII-XII intact, normal strength, sensation and reflexes    throughout       Assessment:   Patient Active Problem List   Diagnosis Date Noted  . Vaginal bleeding 09/09/2013  . History of dysmenorrhea 09/09/2013  . High blood pressure 09/09/2013  . Abnormal weight gain 09/09/2013  . Family history of diabetes mellitus 09/09/2013  Breast Pain Recent vaginal bleeding Partial Hysterectomy  Plan:    Education reviewed: calcium supplements, low fat, low cholesterol diet, safe sex/STD prevention, self breast exams, skin cancer screening, smoking cessation and weight bearing exercise. Mammogram ordered. Follow up in: 2 if bleeding were to continue, please see MD for eval. Patient to RTC in 1 year for annual..   Discussed vaginal bleeding w/ MD Clearance Coots, this can be normal following partial hysterectomy if there was any uterine tissue that remained. Cont to monitor, patient to f/u if there is a change in her symptoms.  RTC in 1 year for annual. HgA1C, TSH, Pap ordered today.  Patient to est care w/ PCP. Recommended cholesterol screening at that appt.  50 min spent with patient greater than 80% spent in counseling and coordination of care.  Avani Sensabaugh Wilson Singer CNM

## 2013-09-10 LAB — PAP IG W/ RFLX HPV ASCU

## 2013-09-10 LAB — WET PREP BY MOLECULAR PROBE
CANDIDA SPECIES: NEGATIVE
Gardnerella vaginalis: POSITIVE — AB
TRICHOMONAS VAG: POSITIVE — AB

## 2013-09-11 ENCOUNTER — Other Ambulatory Visit: Payer: Self-pay | Admitting: Advanced Practice Midwife

## 2013-09-11 MED ORDER — METRONIDAZOLE 500 MG PO TABS: 500.0000 mg | ORAL_TABLET | Freq: Two times a day (BID) | ORAL | Status: DC

## 2013-09-16 ENCOUNTER — Ambulatory Visit (HOSPITAL_COMMUNITY): Payer: Medicaid - Out of State

## 2013-09-23 ENCOUNTER — Ambulatory Visit (HOSPITAL_COMMUNITY)
Admission: RE | Admit: 2013-09-23 | Discharge: 2013-09-23 | Disposition: A | Discharge status: Home / Self Care | Payer: Medicaid Other | Source: Ambulatory Visit | Attending: Advanced Practice Midwife | Admitting: Advanced Practice Midwife

## 2013-09-23 DIAGNOSIS — Z1231 Encounter for screening mammogram for malignant neoplasm of breast: Secondary | ICD-10-CM | POA: Insufficient documentation

## 2013-09-23 DIAGNOSIS — Z0000 Encounter for general adult medical examination without abnormal findings: Secondary | ICD-10-CM

## 2013-09-23 DIAGNOSIS — N644 Mastodynia: Secondary | ICD-10-CM

## 2013-10-02 ENCOUNTER — Encounter: Payer: Self-pay | Admitting: *Deleted

## 2013-10-13 ENCOUNTER — Other Ambulatory Visit: Payer: Self-pay | Admitting: Advanced Practice Midwife

## 2013-10-13 DIAGNOSIS — R928 Other abnormal and inconclusive findings on diagnostic imaging of breast: Secondary | ICD-10-CM

## 2013-10-28 ENCOUNTER — Other Ambulatory Visit: Payer: Medicaid - Out of State

## 2013-11-18 ENCOUNTER — Other Ambulatory Visit: Payer: Self-pay | Admitting: Advanced Practice Midwife

## 2013-11-18 ENCOUNTER — Ambulatory Visit
Admission: RE | Admit: 2013-11-18 | Discharge: 2013-11-18 | Disposition: A | Discharge status: Home / Self Care | Payer: Medicaid Other | Source: Ambulatory Visit | Attending: Advanced Practice Midwife | Admitting: Advanced Practice Midwife

## 2013-11-18 DIAGNOSIS — R928 Other abnormal and inconclusive findings on diagnostic imaging of breast: Secondary | ICD-10-CM

## 2013-12-02 ENCOUNTER — Ambulatory Visit
Admission: RE | Admit: 2013-12-02 | Discharge: 2013-12-02 | Disposition: A | Discharge status: Home / Self Care | Payer: Medicaid Other | Source: Ambulatory Visit | Attending: Advanced Practice Midwife | Admitting: Advanced Practice Midwife

## 2013-12-02 DIAGNOSIS — R928 Other abnormal and inconclusive findings on diagnostic imaging of breast: Secondary | ICD-10-CM

## 2014-03-12 ENCOUNTER — Encounter: Payer: Self-pay | Admitting: Gastroenterology

## 2014-03-17 ENCOUNTER — Ambulatory Visit: Payer: Medicaid Other | Admitting: Gastroenterology

## 2014-04-06 ENCOUNTER — Encounter: Payer: Self-pay | Admitting: Advanced Practice Midwife

## 2014-04-09 ENCOUNTER — Other Ambulatory Visit: Payer: Self-pay | Admitting: Family Medicine

## 2014-04-09 DIAGNOSIS — N63 Unspecified lump in unspecified breast: Secondary | ICD-10-CM

## 2014-05-12 ENCOUNTER — Other Ambulatory Visit: Payer: Self-pay | Admitting: Family Medicine

## 2014-05-12 ENCOUNTER — Ambulatory Visit
Admission: RE | Admit: 2014-05-12 | Discharge: 2014-05-12 | Disposition: A | Discharge status: Home / Self Care | Payer: Medicaid Other | Source: Ambulatory Visit | Attending: Family Medicine | Admitting: Family Medicine

## 2014-05-12 DIAGNOSIS — N63 Unspecified lump in unspecified breast: Secondary | ICD-10-CM

## 2014-05-18 ENCOUNTER — Other Ambulatory Visit: Payer: Self-pay | Admitting: Family Medicine

## 2014-05-18 DIAGNOSIS — N63 Unspecified lump in unspecified breast: Secondary | ICD-10-CM

## 2014-05-22 ENCOUNTER — Ambulatory Visit
Admission: RE | Admit: 2014-05-22 | Discharge: 2014-05-22 | Disposition: A | Discharge status: Home / Self Care | Payer: Medicaid Other | Source: Ambulatory Visit | Attending: Family Medicine | Admitting: Family Medicine

## 2014-05-22 DIAGNOSIS — N63 Unspecified lump in unspecified breast: Secondary | ICD-10-CM

## 2014-06-02 ENCOUNTER — Ambulatory Visit (INDEPENDENT_AMBULATORY_CARE_PROVIDER_SITE_OTHER): Payer: Medicaid Other | Admitting: Gastroenterology

## 2014-06-02 ENCOUNTER — Encounter: Payer: Self-pay | Admitting: Gastroenterology

## 2014-06-02 ENCOUNTER — Other Ambulatory Visit (INDEPENDENT_AMBULATORY_CARE_PROVIDER_SITE_OTHER): Payer: Medicaid Other

## 2014-06-02 VITALS — BP 122/78 | HR 76 | Ht 64.0 in | Wt 200.2 lb

## 2014-06-02 DIAGNOSIS — R109 Unspecified abdominal pain: Secondary | ICD-10-CM

## 2014-06-02 LAB — CBC WITH DIFFERENTIAL/PLATELET
Basophils Absolute: 0 10*3/uL (ref 0.0–0.1)
Basophils Relative: 0.4 % (ref 0.0–3.0)
EOS PCT: 4.5 % (ref 0.0–5.0)
Eosinophils Absolute: 0.3 10*3/uL (ref 0.0–0.7)
HCT: 37.9 % (ref 36.0–46.0)
Hemoglobin: 12.2 g/dL (ref 12.0–15.0)
Lymphocytes Relative: 49.1 % — ABNORMAL HIGH (ref 12.0–46.0)
Lymphs Abs: 3.7 10*3/uL (ref 0.7–4.0)
MCHC: 32.3 g/dL (ref 30.0–36.0)
MCV: 78 fl (ref 78.0–100.0)
Monocytes Absolute: 0.4 10*3/uL (ref 0.1–1.0)
Monocytes Relative: 5.8 % (ref 3.0–12.0)
NEUTROS ABS: 3 10*3/uL (ref 1.4–7.7)
Neutrophils Relative %: 40.2 % — ABNORMAL LOW (ref 43.0–77.0)
Platelets: 323 10*3/uL (ref 150.0–400.0)
RBC: 4.86 Mil/uL (ref 3.87–5.11)
RDW: 14.2 % (ref 11.5–15.5)
WBC: 7.6 10*3/uL (ref 4.0–10.5)

## 2014-06-02 LAB — COMPREHENSIVE METABOLIC PANEL
ALT: 27 U/L (ref 0–35)
AST: 23 U/L (ref 0–37)
Albumin: 3.7 g/dL (ref 3.5–5.2)
Alkaline Phosphatase: 113 U/L (ref 39–117)
BUN: 11 mg/dL (ref 6–23)
CALCIUM: 8.7 mg/dL (ref 8.4–10.5)
CHLORIDE: 105 meq/L (ref 96–112)
CO2: 28 mEq/L (ref 19–32)
CREATININE: 0.8 mg/dL (ref 0.4–1.2)
GFR: 104.11 mL/min (ref >60.00)
GLUCOSE: 108 mg/dL — AB (ref 70–99)
Potassium: 3.6 mEq/L (ref 3.5–5.1)
Sodium: 141 mEq/L (ref 135–145)
Total Bilirubin: 0.2 mg/dL (ref 0.2–1.2)
Total Protein: 7.7 g/dL (ref 6.0–8.3)

## 2014-06-02 NOTE — Patient Instructions (Addendum)
We will get records sent from your previous testing [facility]([employer] in [place])  This will include any labs, imaging results (US and CT scan). You will have labs checked today in the basement lab.  Please head down after you check out with the front desk  (cbc, cmet). Pending review, will decide on further workup, testing.

## 2014-06-02 NOTE — Progress Notes (Signed)
HPI: This is a   very pleasant 48 year old woman whom I am meeting for the first time today.  She wants EGD.  She tells me she has been in ER a thousand times.  With abdominal pains, these occur intermittently about 2 times per month. Can last hours at a time.  No radiating of pain, no associated nausea.  CAnnot characterize the pain very well, maybe a twisting.  Can occur at night.  Eating does not regularly bring it on. Last time she had the pain was 3 months ago.  Only with pain meds will it go away.  Says she's been to [place] ER a dozen times.  She did have an US, CT ([facility] on [address] within the past year).  Tells me she was admitted to that hospital for a week, no records from that visit.  Overall her weight is going up "like crazy," has gained 35 pounds in past 6 months.  She does not take NSAIDs  Review of systems: Pertinent positive and negative review of systems were noted in the above HPI section. Complete review of systems was performed and was otherwise normal.    Past Medical History  Diagnosis Date  . GERD (gastroesophageal reflux disease)     Past Surgical History  Procedure Laterality Date  . Abdominal hysterectomy    . Cholecystectomy    . Tubal ligation      Current Outpatient Prescriptions  Medication Sig Dispense Refill  . atorvastatin (LIPITOR) 80 MG tablet Take 80 mg by mouth daily.     . divalproex (DEPAKOTE) 250 MG DR tablet Take 500 mg by mouth every morning.     . hydrOXYzine (ATARAX/VISTARIL) 25 MG tablet Take 25 mg by mouth daily. Pt takes 2 tablets at bedtime to sleep    . lisinopril (PRINIVIL,ZESTRIL) 5 MG tablet Take 5 mg by mouth daily.     Marland Kitchen. omeprazole (PRILOSEC OTC) 20 MG tablet Take 40 mg by mouth daily.     . QUEtiapine (SEROQUEL) 200 MG tablet Take 200 mg by mouth daily. Pt takes by mouth at bedtime     No current facility-administered medications for this visit.    Allergies as of 06/02/2014  . (No Known Allergies)     Family History  Problem Relation Age of Onset  . Hypertension Mother   . Bipolar disorder Mother   . Breast cancer Mother   . Breast cancer Paternal Aunt   . Breast cancer Maternal Grandmother   . Stomach cancer Paternal Uncle   . Diabetes Mother   . Diabetes Father   . Diabetes Sister     x2  . Diabetes Brother   . Colon cancer Neg Hx   . Colon polyps Neg Hx   . Esophageal cancer Neg Hx   . Gallbladder disease Neg Hx     History   Social History  . Marital Status: Single    Spouse Name: N/A    Number of Children: 3  . Years of Education: N/A   Occupational History  . Homemaker    Social History Main Topics  . Smoking status: Never Smoker   . Smokeless tobacco: Never Used  . Alcohol Use: No     Comment: rarely  . Drug Use: No  . Sexual Activity: Not Currently    Birth Control/ Protection: Surgical     Comment: Total Hysterectomy    Other Topics Concern  . Not on file   Social History Narrative  Physical Exam: BP 122/78 mmHg  Pulse 76  Ht 5\' 4"  (1.626 m)  Wt 200 lb 4 oz (90.833 kg)  BMI 34.36 kg/m2  LMP 06/05/2013 Constitutional: generally well-appearing Psychiatric: alert and oriented x3 Eyes: extraocular movements intact Mouth: oral pharynx moist, no lesions Neck: supple no lymphadenopathy Cardiovascular: heart regular rate and rhythm Lungs: clear to auscultation bilaterally Abdomen: soft, nontender, nondistended, no obvious ascites, no peritoneal signs, normal bowel sounds Extremities: no lower extremity edema bilaterally Skin: no lesions on visible extremities    Assessment and plan: 48 y.o. female with  intermittent abdominal pains she tells me she has been hospitalized for these pains in [place], she has undergone ultrasound and CAT scans. We have none of those records here for review. She would like an upper endoscopy however I would like to review her previous records before committing to invasive testing such as that. She has  gained 35 pounds in the past 6 months that argues pretty strongly that there is nothing serious such as cancer or significant ulcer going on here. She will continue on her once daily proton pump inhibitor for now we will gather records to this help decide what the next best test should be for her.

## 2014-11-10 ENCOUNTER — Other Ambulatory Visit: Payer: Self-pay

## 2014-11-10 DIAGNOSIS — Z1231 Encounter for screening mammogram for malignant neoplasm of breast: Secondary | ICD-10-CM

## 2014-11-18 ENCOUNTER — Ambulatory Visit: Payer: Medicaid Other

## 2014-11-24 ENCOUNTER — Ambulatory Visit: Payer: Medicaid Other

## 2014-11-27 ENCOUNTER — Ambulatory Visit
Admission: RE | Admit: 2014-11-27 | Discharge: 2014-11-27 | Disposition: A | Discharge status: Home / Self Care | Payer: Medicaid Other | Source: Ambulatory Visit

## 2014-11-27 DIAGNOSIS — Z1231 Encounter for screening mammogram for malignant neoplasm of breast: Secondary | ICD-10-CM

## 2015-01-13 ENCOUNTER — Ambulatory Visit: Payer: Medicaid Other | Admitting: Certified Nurse Midwife

## 2015-02-09 ENCOUNTER — Ambulatory Visit: Payer: Medicaid Other | Admitting: Certified Nurse Midwife

## 2015-03-30 ENCOUNTER — Ambulatory Visit: Payer: Medicaid Other | Admitting: Certified Nurse Midwife

## 2015-07-13 ENCOUNTER — Ambulatory Visit: Payer: Medicaid Other | Admitting: Certified Nurse Midwife

## 2015-07-22 ENCOUNTER — Ambulatory Visit (INDEPENDENT_AMBULATORY_CARE_PROVIDER_SITE_OTHER): Payer: Medicaid Other | Admitting: Certified Nurse Midwife

## 2015-07-22 ENCOUNTER — Encounter: Payer: Self-pay | Admitting: Certified Nurse Midwife

## 2015-07-22 VITALS — BP 122/79 | HR 69 | Ht 64.0 in | Wt 194.0 lb

## 2015-07-22 DIAGNOSIS — Z01419 Encounter for gynecological examination (general) (routine) without abnormal findings: Secondary | ICD-10-CM

## 2015-07-22 DIAGNOSIS — Z0000 Encounter for general adult medical examination without abnormal findings: Secondary | ICD-10-CM | POA: Diagnosis not present

## 2015-07-22 DIAGNOSIS — Z113 Encounter for screening for infections with a predominantly sexual mode of transmission: Secondary | ICD-10-CM

## 2015-07-22 DIAGNOSIS — N760 Acute vaginitis: Secondary | ICD-10-CM

## 2015-07-22 DIAGNOSIS — N904 Leukoplakia of vulva: Secondary | ICD-10-CM

## 2015-07-22 MED ORDER — FLUCONAZOLE 100 MG PO TABS: 100.0000 mg | ORAL_TABLET | Freq: Once | ORAL | Status: AC

## 2015-07-22 MED ORDER — TERCONAZOLE 0.4 % VA CREA: 1.0000 | TOPICAL_CREAM | Freq: Every day | VAGINAL | Status: AC

## 2015-07-22 MED ORDER — CLOBETASOL PROPIONATE 0.05 % EX CREA: 1.0000 "application " | TOPICAL_CREAM | Freq: Two times a day (BID) | CUTANEOUS | Status: AC

## 2015-07-22 NOTE — Patient Instructions (Signed)
Trichomoniasis Trichomoniasis is an infection caused by an organism called Trichomonas. The infection can affect both women and men. In women, the outer female genitalia and the vagina are affected. In men, the penis is mainly affected, but the prostate and other reproductive organs can also be involved. Trichomoniasis is a sexually transmitted infection (STI) and is most often passed to another person through sexual contact.  RISK FACTORS  Having unprotected sexual intercourse.  Having sexual intercourse with an infected partner. SIGNS AND SYMPTOMS  Symptoms of trichomoniasis in women include:  Abnormal gray-green frothy vaginal discharge.  Itching and irritation of the vagina.  Itching and irritation of the area outside the vagina. Symptoms of trichomoniasis in men include:   Penile discharge with or without pain.  Pain during urination. This results from inflammation of the urethra. DIAGNOSIS  Trichomoniasis may be found during a Pap test or physical exam. Your health care provider may use one of the following methods to help diagnose this infection:  Testing the pH of the vagina with a test tape.  Using a vaginal swab test that checks for the Trichomonas organism. A test is available that provides results within a few minutes.  Examining a urine sample.  Testing vaginal secretions. Your health care provider may test you for other STIs, including HIV. TREATMENT   You may be given medicine to fight the infection. Women should inform their health care provider if they could be or are pregnant. Some medicines used to treat the infection should not be taken during pregnancy.  Your health care provider may recommend over-the-counter medicines or creams to decrease itching or irritation.  Your sexual partner will need to be treated if infected.  Your health care provider may test you for infection again 3 months after treatment. HOME CARE INSTRUCTIONS   Take medicines only as  directed by your health care provider.  Take over-the-counter medicine for itching or irritation as directed by your health care provider.  Do not have sexual intercourse while you have the infection.  Women should not douche or wear tampons while they have the infection.  Discuss your infection with your partner. Your partner may have gotten the infection from you, or you may have gotten it from your partner.  Have your sex partner get examined and treated if necessary.  Practice safe, informed, and protected sex.  See your health care provider for other STI testing. SEEK MEDICAL CARE IF:   You still have symptoms after you finish your medicine.  You develop abdominal pain.  You have pain when you urinate.  You have bleeding after sexual intercourse.  You develop a rash.  Your medicine makes you sick or makes you throw up (vomit). MAKE SURE YOU:  Understand these instructions.  Will watch your condition.  Will get help right away if you are not doing well or get worse.   This information is not intended to replace advice given to you by your health care provider. Make sure you discuss any questions you have with your health care provider.   Document Released: 11/15/2000 Document Revised: 06/12/2014 Document Reviewed: 03/03/2013 Elsevier Interactive Patient Education 2016 Elsevier Inc.  
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
## 2015-07-23 ENCOUNTER — Encounter: Payer: Self-pay | Admitting: Certified Nurse Midwife

## 2015-07-26 LAB — PAP, TP IMAGING W/ HPV RNA, RFLX HPV TYPE 16,18/45: HPV mRNA, High Risk: NOT DETECTED

## 2015-07-28 LAB — SURESWAB, VAGINOSIS/VAGINITIS PLUS
Atopobium vaginae: 7.4 Log (cells/mL)
C. ALBICANS, DNA: NOT DETECTED
C. GLABRATA, DNA: DETECTED — AB
C. TRACHOMATIS RNA, TMA: NOT DETECTED
C. TROPICALIS, DNA: NOT DETECTED
C. parapsilosis, DNA: NOT DETECTED
GARDNERELLA VAGINALIS: 5.3 Log (cells/mL)
LACTOBACILLUS SPECIES: NOT DETECTED Log (cells/mL)
MEGASPHAERA SPECIES: NOT DETECTED Log (cells/mL)
N. gonorrhoeae RNA, TMA: NOT DETECTED
T. vaginalis RNA, QL TMA: NOT DETECTED

## 2015-08-01 ENCOUNTER — Other Ambulatory Visit: Payer: Self-pay | Admitting: Certified Nurse Midwife

## 2015-08-01 DIAGNOSIS — B9689 Other specified bacterial agents as the cause of diseases classified elsewhere: Secondary | ICD-10-CM

## 2015-08-01 DIAGNOSIS — N760 Acute vaginitis: Principal | ICD-10-CM

## 2015-08-01 MED ORDER — CLINDAMYCIN PHOSPHATE 100 MG VA SUPP: 100.0000 mg | Freq: Every day | VAGINAL | Status: AC

## 2015-08-18 ENCOUNTER — Encounter: Payer: Self-pay | Admitting: *Deleted

## 2015-08-19 ENCOUNTER — Ambulatory Visit: Payer: Medicaid Other | Admitting: Certified Nurse Midwife

## 2015-08-31 ENCOUNTER — Ambulatory Visit: Payer: Medicaid Other | Admitting: Certified Nurse Midwife

## 2015-11-29 ENCOUNTER — Other Ambulatory Visit: Payer: Self-pay | Admitting: Family Medicine

## 2015-11-29 DIAGNOSIS — Z1231 Encounter for screening mammogram for malignant neoplasm of breast: Secondary | ICD-10-CM

## 2015-12-08 ENCOUNTER — Ambulatory Visit: Payer: Medicaid Other

## 2015-12-31 ENCOUNTER — Ambulatory Visit: Payer: Medicaid Other

## 2016-01-12 ENCOUNTER — Ambulatory Visit: Payer: Medicaid Other

## 2016-01-19 ENCOUNTER — Ambulatory Visit
Admission: RE | Admit: 2016-01-19 | Discharge: 2016-01-19 | Disposition: A | Discharge status: Home / Self Care | Payer: Medicaid Other | Source: Ambulatory Visit | Attending: Family Medicine | Admitting: Family Medicine

## 2016-01-19 DIAGNOSIS — Z1231 Encounter for screening mammogram for malignant neoplasm of breast: Secondary | ICD-10-CM

## 2016-12-12 ENCOUNTER — Other Ambulatory Visit: Payer: Self-pay | Admitting: Family Medicine

## 2016-12-12 DIAGNOSIS — Z1231 Encounter for screening mammogram for malignant neoplasm of breast: Secondary | ICD-10-CM

## 2017-01-19 ENCOUNTER — Ambulatory Visit: Payer: Medicaid Other

## 2017-03-08 ENCOUNTER — Ambulatory Visit: Payer: Self-pay

## 2017-05-21 ENCOUNTER — Ambulatory Visit: Payer: Medicaid Other | Admitting: Obstetrics

## 2017-05-22 ENCOUNTER — Ambulatory Visit: Payer: Medicaid Other

## 2017-05-25 ENCOUNTER — Ambulatory Visit: Payer: Medicaid Other

## 2017-05-25 ENCOUNTER — Ambulatory Visit
Admission: RE | Admit: 2017-05-25 | Discharge: 2017-05-25 | Disposition: A | Discharge status: Home / Self Care | Payer: Medicaid Other | Source: Ambulatory Visit | Attending: Family Medicine | Admitting: Family Medicine

## 2017-05-25 DIAGNOSIS — Z1231 Encounter for screening mammogram for malignant neoplasm of breast: Secondary | ICD-10-CM

## 2017-05-30 ENCOUNTER — Ambulatory Visit: Payer: Medicaid Other | Admitting: Obstetrics

## 2017-06-01 ENCOUNTER — Encounter: Payer: Self-pay | Admitting: Obstetrics

## 2017-06-01 ENCOUNTER — Ambulatory Visit: Payer: Medicaid Other | Admitting: Obstetrics

## 2017-06-01 ENCOUNTER — Other Ambulatory Visit (HOSPITAL_COMMUNITY)
Admission: RE | Admit: 2017-06-01 | Discharge: 2017-06-01 | Disposition: A | Discharge status: Home / Self Care | Payer: Medicaid Other | Source: Ambulatory Visit | Attending: Obstetrics | Admitting: Obstetrics

## 2017-06-01 VITALS — Ht 64.0 in | Wt 201.0 lb

## 2017-06-01 DIAGNOSIS — N898 Other specified noninflammatory disorders of vagina: Secondary | ICD-10-CM

## 2017-06-01 DIAGNOSIS — Z0000 Encounter for general adult medical examination without abnormal findings: Secondary | ICD-10-CM | POA: Diagnosis not present

## 2017-06-01 DIAGNOSIS — Z01419 Encounter for gynecological examination (general) (routine) without abnormal findings: Secondary | ICD-10-CM | POA: Insufficient documentation

## 2017-06-01 DIAGNOSIS — B373 Candidiasis of vulva and vagina: Secondary | ICD-10-CM | POA: Insufficient documentation

## 2017-06-01 DIAGNOSIS — Z113 Encounter for screening for infections with a predominantly sexual mode of transmission: Secondary | ICD-10-CM

## 2017-06-01 DIAGNOSIS — Z90711 Acquired absence of uterus with remaining cervical stump: Secondary | ICD-10-CM

## 2017-06-01 NOTE — Progress Notes (Signed)
Patient is in the office for annual exam, last pap 07-22-15, hx of hysterectomy 2011

## 2017-06-01 NOTE — Patient Instructions (Addendum)

## 2017-06-01 NOTE — Progress Notes (Signed)
Subjective:        Courtney AversKaren Sandner is a 51 y.o. female here for a routine exam.  Current complaints: None.    Personal health questionnaire:  Is patient Ashkenazi Jewish, have a family history of breast and/or ovarian cancer: no Is there a family history of uterine cancer diagnosed at age < 4150, gastrointestinal cancer, urinary tract cancer, family member who is a Personnel officerLynch syndrome-associated carrier: no Is the patient overweight and hypertensive, family history of diabetes, personal history of gestational diabetes, preeclampsia or PCOS: no Is patient over 6555, have PCOS,  family history of premature CHD under age 51, diabetes, smoke, have hypertension or peripheral artery disease:  no At any time, has a partner hit, kicked or otherwise hurt or frightened you?: no Over the past 2 weeks, have you felt down, depressed or hopeless?: no Over the past 2 weeks, have you felt little interest or pleasure in doing things?:no   Gynecologic History Patient's last menstrual period was 06/05/2013. Contraception: status post hysterectomy Last Pap: 2017. Results were: normal Last mammogram: 2018. Results were: normal  Obstetric History OB History  Gravida Para Term Preterm AB Living  4 3 3   1 3   SAB TAB Ectopic Multiple Live Births  1       3    # Outcome Date GA Lbr Len/2nd Weight Sex Delivery Anes PTL Lv  4 Term 02/17/88 9739w0d  7 lb 8 oz (3.402 kg) M Vag-Spont EPI  LIV  3 Term 11/20/86 9039w0d  8 lb 7 oz (3.827 kg) M Vag-Spont None  LIV  2 SAB 1987        DEC  1 Term 09/20/83 10239w0d  9 lb 14 oz (4.479 kg) M Vag-Spont None  LIV      Past Medical History:  Diagnosis Date  . GERD (gastroesophageal reflux disease)     Past Surgical History:  Procedure Laterality Date  . ABDOMINAL HYSTERECTOMY    . CHOLECYSTECTOMY    . TUBAL LIGATION       Current Outpatient Medications:  .  esomeprazole (NEXIUM) 20 MG packet, Take 20 mg by mouth daily before breakfast., Disp: , Rfl:  .  lisinopril  (PRINIVIL,ZESTRIL) 5 MG tablet, Take 5 mg by mouth daily. , Disp: , Rfl:  .  QUEtiapine (SEROQUEL) 200 MG tablet, Take 200 mg by mouth daily. Pt takes by mouth at bedtime, Disp: , Rfl:  .  atorvastatin (LIPITOR) 80 MG tablet, Take 80 mg by mouth daily. , Disp: , Rfl:  .  clindamycin (CLEOCIN) 100 MG vaginal suppository, Place 1 suppository (100 mg total) vaginally at bedtime. (Patient not taking: Reported on 06/01/2017), Disp: 6 suppository, Rfl: 0 .  clobetasol cream (TEMOVATE) 0.05 %, Apply 1 application topically 2 (two) times daily. (Patient not taking: Reported on 06/01/2017), Disp: 30 g, Rfl: 0 .  divalproex (DEPAKOTE) 250 MG DR tablet, Take 500 mg by mouth every morning. Reported on 07/22/2015, Disp: , Rfl:  .  fluconazole (DIFLUCAN) 100 MG tablet, Take 1 tablet (100 mg total) by mouth once. Repeat dose in 48-72 hour. (Patient not taking: Reported on 06/01/2017), Disp: 3 tablet, Rfl: 0 .  hydrOXYzine (ATARAX/VISTARIL) 25 MG tablet, Take 25 mg by mouth daily. Reported on 07/22/2015, Disp: , Rfl:  .  omeprazole (PRILOSEC OTC) 20 MG tablet, Take 40 mg by mouth daily. , Disp: , Rfl:  .  terconazole (TERAZOL 7) 0.4 % vaginal cream, Place 1 applicator vaginally at bedtime. (Patient not taking: Reported on 06/01/2017),  Disp: 45 g, Rfl: 0 No Known Allergies  Social History   Tobacco Use  . Smoking status: Never Smoker  . Smokeless tobacco: Never Used  Substance Use Topics  . Alcohol use: No    Alcohol/week: 0.0 oz    Comment: rarely    Family History  Problem Relation Age of Onset  . Hypertension Mother   . Bipolar disorder Mother   . Diabetes Mother   . Diabetes Father   . Breast cancer Paternal Aunt   . Breast cancer Maternal Grandmother   . Stomach cancer Paternal Uncle   . Diabetes Sister        x2  . Diabetes Brother   . Colon cancer Neg Hx   . Colon polyps Neg Hx   . Esophageal cancer Neg Hx   . Gallbladder disease Neg Hx       Review of Systems  Constitutional: negative  for fatigue and weight loss Respiratory: negative for cough and wheezing Cardiovascular: negative for chest pain, fatigue and palpitations Gastrointestinal: negative for abdominal pain and change in bowel habits Musculoskeletal:negative for myalgias Neurological: negative for gait problems and tremors Behavioral/Psych: negative for abusive relationship, depression Endocrine: negative for temperature intolerance    Genitourinary:negative for abnormal menstrual periods, genital lesions, hot flashes, sexual problems and vaginal discharge Integument/breast: negative for breast lump, breast tenderness, nipple discharge and skin lesion(s)    Objective:       Ht 5\' 4"  (1.626 m)   Wt 201 lb (91.2 kg)   LMP 06/05/2013   BMI 34.50 kg/m  General:   alert  Skin:   no rash or abnormalities  Lungs:   clear to auscultation bilaterally  Heart:   regular rate and rhythm, S1, S2 normal, no murmur, click, rub or gallop  Breasts:   normal without suspicious masses, skin or nipple changes or axillary nodes  Abdomen:  normal findings: no organomegaly, soft, non-tender and no hernia  Pelvis:  External genitalia: normal general appearance Urinary system: urethral meatus normal and bladder without fullness, nontender Vaginal: normal without tenderness, induration or masses Cervix: normal appearance Adnexa: normal bimanual exam Uterus: absent   Lab Review Urine pregnancy test Labs reviewed yes Radiologic studies reviewed yes  50% of 20 min visit spent on counseling and coordination of care.   Assessment:     1. Encounter for gynecological examination Rx: - Cytology - PAP  2. S/P abdominal supracervical subtotal hysterectomy  3. Screening for STD (sexually transmitted disease) Rx: - HIV antibody - Hepatitis B surface antigen - RPR - Hepatitis C antibody  4. Vaginal discharge Rx: - Cervicovaginal ancillary only   Plan:    Education reviewed: calcium supplements, depression  evaluation, low fat, low cholesterol diet, safe sex/STD prevention, self breast exams and weight bearing exercise. Follow up in: 1 year.   No orders of the defined types were placed in this encounter.  Orders Placed This Encounter  Procedures  . HIV antibody  . Hepatitis B surface antigen  . RPR  . Hepatitis C antibody

## 2017-06-02 LAB — HIV ANTIBODY (ROUTINE TESTING W REFLEX): HIV Screen 4th Generation wRfx: NONREACTIVE

## 2017-06-02 LAB — RPR: RPR: NONREACTIVE

## 2017-06-02 LAB — HEPATITIS B SURFACE ANTIGEN: HEP B S AG: NEGATIVE

## 2017-06-02 LAB — HEPATITIS C ANTIBODY: HEP C VIRUS AB: 0.1 {s_co_ratio} (ref 0.0–0.9)

## 2017-06-06 LAB — CERVICOVAGINAL ANCILLARY ONLY
BACTERIAL VAGINITIS: NEGATIVE
Candida vaginitis: POSITIVE — AB
Chlamydia: NEGATIVE
Neisseria Gonorrhea: NEGATIVE
TRICH (WINDOWPATH): NEGATIVE

## 2017-06-07 ENCOUNTER — Other Ambulatory Visit: Payer: Self-pay | Admitting: Obstetrics

## 2017-06-07 DIAGNOSIS — B3731 Acute candidiasis of vulva and vagina: Secondary | ICD-10-CM

## 2017-06-07 DIAGNOSIS — B373 Candidiasis of vulva and vagina: Secondary | ICD-10-CM

## 2017-06-07 LAB — CYTOLOGY - PAP
Diagnosis: NEGATIVE
HPV: NOT DETECTED

## 2017-06-07 MED ORDER — FLUCONAZOLE 150 MG PO TABS: 150.0000 mg | ORAL_TABLET | Freq: Once | ORAL | 2 refills | Status: AC

## 2017-06-27 ENCOUNTER — Telehealth: Payer: Self-pay

## 2017-06-27 NOTE — Telephone Encounter (Signed)
Received VM from pt. Pt did not indicate nature of her call. Unable to LVM; pt's VM is not setup.

## 2018-02-27 ENCOUNTER — Other Ambulatory Visit: Payer: Self-pay | Admitting: Family Medicine

## 2018-02-27 DIAGNOSIS — Z1231 Encounter for screening mammogram for malignant neoplasm of breast: Secondary | ICD-10-CM

## 2018-05-27 ENCOUNTER — Ambulatory Visit
Admission: RE | Admit: 2018-05-27 | Discharge: 2018-05-27 | Disposition: A | Discharge status: Home / Self Care | Payer: Medicaid Other | Source: Ambulatory Visit | Attending: Family Medicine | Admitting: Family Medicine

## 2018-05-27 DIAGNOSIS — Z1231 Encounter for screening mammogram for malignant neoplasm of breast: Secondary | ICD-10-CM

## 2018-06-10 ENCOUNTER — Emergency Department (HOSPITAL_COMMUNITY)
Admission: EM | Admit: 2018-06-10 | Discharge: 2018-06-10 | Disposition: A | Discharge status: Home / Self Care | Payer: Medicaid Other | Attending: Emergency Medicine | Admitting: Emergency Medicine

## 2018-06-10 ENCOUNTER — Encounter (HOSPITAL_COMMUNITY): Payer: Self-pay

## 2018-06-10 ENCOUNTER — Other Ambulatory Visit: Payer: Self-pay

## 2018-06-10 DIAGNOSIS — R1110 Vomiting, unspecified: Secondary | ICD-10-CM | POA: Diagnosis present

## 2018-06-10 DIAGNOSIS — I10 Essential (primary) hypertension: Secondary | ICD-10-CM | POA: Diagnosis not present

## 2018-06-10 DIAGNOSIS — R197 Diarrhea, unspecified: Secondary | ICD-10-CM | POA: Diagnosis not present

## 2018-06-10 DIAGNOSIS — J069 Acute upper respiratory infection, unspecified: Secondary | ICD-10-CM | POA: Diagnosis not present

## 2018-06-10 DIAGNOSIS — R112 Nausea with vomiting, unspecified: Secondary | ICD-10-CM | POA: Diagnosis not present

## 2018-06-10 DIAGNOSIS — R05 Cough: Secondary | ICD-10-CM | POA: Diagnosis not present

## 2018-06-10 DIAGNOSIS — E119 Type 2 diabetes mellitus without complications: Secondary | ICD-10-CM | POA: Insufficient documentation

## 2018-06-10 HISTORY — DX: Type 2 diabetes mellitus without complications: E11.9

## 2018-06-10 HISTORY — DX: Major depressive disorder, single episode, unspecified: F32.9

## 2018-06-10 HISTORY — DX: Bipolar disorder, unspecified: F31.9

## 2018-06-10 HISTORY — DX: Depression, unspecified: F32.A

## 2018-06-10 HISTORY — DX: Essential (primary) hypertension: I10

## 2018-06-10 HISTORY — DX: Other abnormal and inconclusive findings on diagnostic imaging of breast: R92.8

## 2018-06-10 HISTORY — DX: Hyperlipidemia, unspecified: E78.5

## 2018-06-10 LAB — URINALYSIS, ROUTINE W REFLEX MICROSCOPIC
Bilirubin Urine: NEGATIVE
Glucose, UA: NEGATIVE mg/dL
Hgb urine dipstick: NEGATIVE
Ketones, ur: NEGATIVE mg/dL
Leukocytes, UA: NEGATIVE
NITRITE: NEGATIVE
Protein, ur: NEGATIVE mg/dL
Specific Gravity, Urine: 1.023 (ref 1.005–1.030)
pH: 5 (ref 5.0–8.0)

## 2018-06-10 LAB — COMPREHENSIVE METABOLIC PANEL
ALT: 22 U/L (ref 0–44)
AST: 26 U/L (ref 15–41)
Albumin: 4 g/dL (ref 3.5–5.0)
Alkaline Phosphatase: 114 U/L (ref 38–126)
Anion gap: 11 (ref 5–15)
BUN: 15 mg/dL (ref 6–20)
CO2: 23 mmol/L (ref 22–32)
Calcium: 9.1 mg/dL (ref 8.9–10.3)
Chloride: 104 mmol/L (ref 98–111)
Creatinine, Ser: 0.78 mg/dL (ref 0.44–1.00)
GFR calc Af Amer: >60 mL/min (ref >60)
GFR calc non Af Amer: >60 mL/min (ref >60)
Glucose, Bld: 118 mg/dL — ABNORMAL HIGH (ref 70–99)
Potassium: 4.3 mmol/L (ref 3.5–5.1)
Sodium: 138 mmol/L (ref 135–145)
Total Bilirubin: 0.3 mg/dL (ref 0.3–1.2)
Total Protein: 8.7 g/dL — ABNORMAL HIGH (ref 6.5–8.1)

## 2018-06-10 LAB — INFLUENZA PANEL BY PCR (TYPE A & B)
Influenza A By PCR: NEGATIVE
Influenza B By PCR: NEGATIVE

## 2018-06-10 LAB — LIPASE, BLOOD: Lipase: 29 U/L (ref 11–51)

## 2018-06-10 LAB — CBC
HCT: 42.3 % (ref 36.0–46.0)
Hemoglobin: 12.9 g/dL (ref 12.0–15.0)
MCH: 25.7 pg — ABNORMAL LOW (ref 26.0–34.0)
MCHC: 30.5 g/dL (ref 30.0–36.0)
MCV: 84.3 fL (ref 80.0–100.0)
Platelets: 298 10*3/uL (ref 150–400)
RBC: 5.02 MIL/uL (ref 3.87–5.11)
RDW: 13.8 % (ref 11.5–15.5)
WBC: 11.3 10*3/uL — AB (ref 4.0–10.5)
nRBC: 0 % (ref 0.0–0.2)

## 2018-06-10 MED ORDER — ACETAMINOPHEN 325 MG PO TABS
650.0000 mg | ORAL_TABLET | Freq: Once | ORAL | Status: AC | PRN
  Administered 2018-06-10: 650 mg via ORAL
  Filled 2018-06-10: qty 2

## 2018-06-10 MED ORDER — SODIUM CHLORIDE 0.9 % IV SOLN
INTRAVENOUS | Status: AC
  Administered 2018-06-10: 15:00:00 via INTRAVENOUS

## 2018-06-10 MED ORDER — BENZONATATE 100 MG PO CAPS: 100.0000 mg | ORAL_CAPSULE | Freq: Three times a day (TID) | ORAL | 0 refills | Status: AC

## 2018-06-10 MED ORDER — ONDANSETRON 4 MG PO TBDP: 4.0000 mg | ORAL_TABLET | Freq: Three times a day (TID) | ORAL | 0 refills | Status: AC | PRN

## 2018-06-10 MED ORDER — FLUTICASONE PROPIONATE 50 MCG/ACT NA SUSP: 1.0000 | Freq: Every day | NASAL | 2 refills | Status: AC

## 2018-06-10 MED ORDER — ONDANSETRON HCL 4 MG/2ML IJ SOLN
4.0000 mg | Freq: Once | INTRAMUSCULAR | Status: AC
  Administered 2018-06-10: 4 mg via INTRAVENOUS
  Filled 2018-06-10: qty 2

## 2018-06-10 NOTE — Discharge Instructions (Signed)
Get plenty of rest and drink fluids so you don't get dehydrated. Follow up with your doctor. Return here as needed.

## 2018-06-10 NOTE — ED Notes (Signed)
Pt provided with water

## 2018-06-10 NOTE — ED Provider Notes (Signed)
[facility]-EMERGENCY DEPT Provider Note   CSN: [id number] Arrival date & time: 06/10/18  1329     History   Chief Complaint Chief Complaint  Patient presents with  . Emesis  . Diarrhea  . Cough    HPI Courtney Gonzalez is a 53 y.o. female who presents to the ED via EMS with flu like symptoms that started today. Patient reports fever, chills, and cough. Patient also c/o n/v/d that started at 4 am. She has not had vomiting or loose stool in the past 3 hours.   The history is provided by the patient. No language interpreter was used.  Emesis   This is a new problem. The current episode started 6 to 12 hours ago. Episode frequency: 5 times today. Progression since onset: no vomiting in the past 3 hours. The maximum temperature recorded prior to her arrival was 100 to 100.9 F. Associated symptoms include chills, cough, diarrhea, a fever, headaches and myalgias. Abdominal pain: cramping. Risk factors include ill contacts.  Diarrhea   This is a new problem. The current episode started 6 to 12 hours ago. Episode frequency: 10 times today. Progression since onset: last stool 3 hours ago. The maximum temperature recorded prior to her arrival was 100 to 100.9 F. Associated symptoms include vomiting, chills, headaches, myalgias and cough. Abdominal pain: cramping. She has tried nothing for the symptoms. Risk factors include ill contacts.  Cough  This is a new problem. The current episode started 3 to 5 hours ago. The cough is non-productive. The maximum temperature recorded prior to her arrival was 100 to 100.9 F. Associated symptoms include chills, ear pain, headaches, sore throat and myalgias. Pertinent negatives include no chest pain. She has tried nothing for the symptoms. She is not a smoker.    Past Medical History:  Diagnosis Date  . Abnormal mammogram   . Bipolar 1 disorder (HCC)   . Depression   . Diabetes mellitus without complication (HCC)   . GERD (gastroesophageal  reflux disease)   . Hyperlipidemia   . Hypertension     Patient Active Problem List   Diagnosis Date Noted  . Vaginal bleeding 09/09/2013  . History of dysmenorrhea 09/09/2013  . High blood pressure 09/09/2013  . Abnormal weight gain 09/09/2013  . Family history of diabetes mellitus 09/09/2013    Past Surgical History:  Procedure Laterality Date  . ABDOMINAL HYSTERECTOMY    . BREAST BIOPSY Right   . CHOLECYSTECTOMY    . TUBAL LIGATION       OB History    Gravida  4   Para  3   Term  3   Preterm      AB  1   Living  3     SAB  1   TAB      Ectopic      Multiple      Live Births  3            Home Medications    Prior to Admission medications   Medication Sig Start Date End Date Taking? Authorizing Provider  atorvastatin (LIPITOR) 80 MG tablet Take 80 mg by mouth daily.  04/27/14 04/27/15  [provider]  benzonatate (TESSALON) 100 MG capsule Take 1 capsule (100 mg total) by mouth every 8 (eight) hours. 06/10/18   Janne NapoleonNeese, Ronna Herskowitz M, NP  clindamycin (CLEOCIN) 100 MG vaginal suppository Place 1 suppository (100 mg total) vaginally at bedtime. Patient not taking: Reported on 06/01/2017 08/01/15   Marjo Bickerenney,  Rachelle A, CNM  clobetasol cream (TEMOVATE) 0.05 % Apply 1 application topically 2 (two) times daily. Patient not taking: Reported on 06/01/2017 07/22/15   Orvilla Cornwall A, CNM  divalproex (DEPAKOTE) 250 MG DR tablet Take 500 mg by mouth every morning. Reported on 07/22/2015    [provider]  esomeprazole (NEXIUM) 20 MG packet Take 20 mg by mouth daily before breakfast.    [provider]  fluconazole (DIFLUCAN) 100 MG tablet Take 1 tablet (100 mg total) by mouth once. Repeat dose in 48-72 hour. Patient not taking: Reported on 06/01/2017 07/22/15   Orvilla Cornwall A, CNM  fluticasone (FLONASE) 50 MCG/ACT nasal spray Place 1 spray into both nostrils daily. 06/10/18   Janne Napoleon, NP  hydrOXYzine (ATARAX/VISTARIL) 25 MG tablet Take  25 mg by mouth daily. Reported on 07/22/2015 12/18/13   [provider]  lisinopril (PRINIVIL,ZESTRIL) 5 MG tablet Take 5 mg by mouth daily.  04/27/14 06/01/17  [provider]  omeprazole (PRILOSEC OTC) 20 MG tablet Take 40 mg by mouth daily.  02/20/14 02/20/15  [provider]  ondansetron (ZOFRAN ODT) 4 MG disintegrating tablet Take 1 tablet (4 mg total) by mouth every 8 (eight) hours as needed for nausea or vomiting. 06/10/18   Janne Napoleon, NP  QUEtiapine (SEROQUEL) 200 MG tablet Take 200 mg by mouth daily. Pt takes by mouth at bedtime 04/01/14   [provider]  terconazole (TERAZOL 7) 0.4 % vaginal cream Place 1 applicator vaginally at bedtime. Patient not taking: Reported on 06/01/2017 07/22/15   Roe Coombs, CNM    Family History Family History  Problem Relation Age of Onset  . Hypertension Mother   . Bipolar disorder Mother   . Diabetes Mother   . Diabetes Father   . Breast cancer Paternal Aunt   . Breast cancer Maternal Grandmother   . Stomach cancer Paternal Uncle   . Diabetes Sister        x2  . Diabetes Brother   . Colon cancer Neg Hx   . Colon polyps Neg Hx   . Esophageal cancer Neg Hx   . Gallbladder disease Neg Hx     Social History Social History   Tobacco Use  . Smoking status: Never Smoker  . Smokeless tobacco: Never Used  Substance Use Topics  . Alcohol use: No    Alcohol/week: 0.0 standard drinks  . Drug use: No     Allergies   Patient has no known allergies.   Review of Systems Review of Systems  Constitutional: Positive for chills and fever.  HENT: Positive for congestion, ear pain, sinus pressure and sore throat.   Eyes: Negative for pain, discharge and itching.  Respiratory: Positive for cough.   Cardiovascular: Negative for chest pain.  Gastrointestinal: Positive for diarrhea and vomiting. Abdominal pain: cramping.  Genitourinary: Negative for dysuria, frequency and urgency.  Musculoskeletal:  Positive for myalgias.  Neurological: Positive for headaches. Negative for syncope.  Hematological: Negative for adenopathy.  Psychiatric/Behavioral: Negative for confusion.     Physical Exam Updated Vital Signs BP 138/74   Pulse 87   Temp (!) 100.6 F (38.1 C) (Oral)   Resp 18   Ht 5\' 4"  (1.626 m)   Wt 84.4 kg   LMP 06/05/2013   SpO2 100%   BMI 31.93 kg/m   Physical Exam Vitals signs and nursing note reviewed.  Constitutional:      Appearance: She is well-developed.  HENT:     Head: Normocephalic  and atraumatic.     Right Ear: Tympanic membrane is erythematous.     Left Ear: Tympanic membrane normal.     Nose: Mucosal edema and congestion present.     Mouth/Throat:     Mouth: Mucous membranes are moist.     Pharynx: Uvula midline. Posterior oropharyngeal erythema present. No oropharyngeal exudate or uvula swelling.  Eyes:     Extraocular Movements: Extraocular movements intact.     Conjunctiva/sclera: Conjunctivae normal.  Neck:     Musculoskeletal: Normal range of motion and neck supple. No neck rigidity.  Cardiovascular:     Rate and Rhythm: Tachycardia present.  Pulmonary:     Effort: Pulmonary effort is normal.  Abdominal:     Palpations: Abdomen is soft.     Tenderness: There is no abdominal tenderness.  Musculoskeletal: Normal range of motion.  Skin:    General: Skin is warm and dry.  Neurological:     Mental Status: She is alert and oriented to person, place, and time.  Psychiatric:        Mood and Affect: Mood normal.      ED Treatments / Results  Labs (all labs ordered are listed, but only abnormal results are displayed) Labs Reviewed  COMPREHENSIVE METABOLIC PANEL - Abnormal; Notable for the following components:      Result Value   Glucose, Bld 118 (*)    Total Protein 8.7 (*)    All other components within normal limits  CBC - Abnormal; Notable for the following components:   WBC 11.3 (*)    MCH 25.7 (*)    All other components within  normal limits  URINALYSIS, ROUTINE W REFLEX MICROSCOPIC - Abnormal; Notable for the following components:   APPearance HAZY (*)    All other components within normal limits  LIPASE, BLOOD  INFLUENZA PANEL BY PCR (TYPE A & B)   Radiology No results found.  Procedures Procedures (including critical care time)  Medications Ordered in ED Medications  0.9 %  sodium chloride infusion ( Intravenous Stopped 06/10/18 1549)  acetaminophen (TYLENOL) tablet 650 mg (650 mg Oral Given 06/10/18 1348)  ondansetron (ZOFRAN) injection 4 mg (4 mg Intravenous Given 06/10/18 1440)     Initial Impression / Assessment and Plan / ED Course  I have reviewed the triage vital signs and the nursing notes.  Patients symptoms are consistent with URI, likely viral etiology. Discussed that antibiotics are not indicated for viral infections. Pt will be discharged with symptomatic treatment.  Verbalizes understanding and is agreeable with plan. Pt is hemodynamically stable & in NAD prior to dc. Patient also with n/v/d that improved with treatment in the ED. Patient taking PO fluids without n/v/d. Will d/c home with Zofran for nausea and Flonase and tessalon for URI symptoms. Patient agrees to f/u with PCP or return for worsening symptoms.   Final Clinical Impressions(s) / ED Diagnoses   Final diagnoses:  Acute URI  Nausea vomiting and diarrhea    ED Discharge Orders         Ordered    ondansetron (ZOFRAN ODT) 4 MG disintegrating tablet  Every 8 hours PRN     06/10/18 1613    fluticasone (FLONASE) 50 MCG/ACT nasal spray  Daily     06/10/18 1613    benzonatate (TESSALON) 100 MG capsule  Every 8 hours     06/10/18 1613           Kerrie [place], NP 06/10/18 2136    Wynetta Fines,  MD 06/17/18 [phone]

## 2018-06-10 NOTE — ED Notes (Signed)
Patient refused Zofran when offered.

## 2018-06-10 NOTE — ED Triage Notes (Signed)
Per EMS- patient c/o N/v/D, chills, and a cough since 0200 today.

## 2019-05-15 ENCOUNTER — Other Ambulatory Visit: Payer: Self-pay | Admitting: Nurse Practitioner

## 2019-05-15 DIAGNOSIS — Z1231 Encounter for screening mammogram for malignant neoplasm of breast: Secondary | ICD-10-CM

## 2019-07-04 ENCOUNTER — Ambulatory Visit: Payer: Medicaid Other

## 2019-07-30 ENCOUNTER — Ambulatory Visit: Payer: Medicaid Other

## 2019-08-26 ENCOUNTER — Ambulatory Visit
Admission: RE | Admit: 2019-08-26 | Discharge: 2019-08-26 | Disposition: A | Discharge status: Home / Self Care | Payer: Medicaid Other | Source: Ambulatory Visit | Attending: Nurse Practitioner | Admitting: Nurse Practitioner

## 2019-08-26 ENCOUNTER — Other Ambulatory Visit: Payer: Self-pay

## 2019-08-26 DIAGNOSIS — Z1231 Encounter for screening mammogram for malignant neoplasm of breast: Secondary | ICD-10-CM

## 2020-08-30 ENCOUNTER — Other Ambulatory Visit: Payer: Self-pay | Admitting: Family Medicine

## 2020-08-30 DIAGNOSIS — Z1231 Encounter for screening mammogram for malignant neoplasm of breast: Secondary | ICD-10-CM

## 2020-10-21 ENCOUNTER — Other Ambulatory Visit: Payer: Self-pay

## 2020-10-21 ENCOUNTER — Ambulatory Visit
Admission: RE | Admit: 2020-10-21 | Discharge: 2020-10-21 | Disposition: A | Discharge status: Home / Self Care | Payer: Medicaid Other | Source: Ambulatory Visit | Attending: Family Medicine | Admitting: Family Medicine

## 2020-10-21 DIAGNOSIS — Z1231 Encounter for screening mammogram for malignant neoplasm of breast: Secondary | ICD-10-CM

## 2021-09-13 ENCOUNTER — Other Ambulatory Visit: Payer: Self-pay | Admitting: Family Medicine

## 2021-09-13 DIAGNOSIS — Z1231 Encounter for screening mammogram for malignant neoplasm of breast: Secondary | ICD-10-CM

## 2021-10-26 ENCOUNTER — Ambulatory Visit: Payer: Medicaid Other

## 2021-11-18 ENCOUNTER — Ambulatory Visit
Admission: RE | Admit: 2021-11-18 | Discharge: 2021-11-18 | Disposition: A | Discharge status: Home / Self Care | Payer: Medicaid Other | Source: Ambulatory Visit | Attending: Family Medicine | Admitting: Family Medicine

## 2021-11-18 DIAGNOSIS — Z1231 Encounter for screening mammogram for malignant neoplasm of breast: Secondary | ICD-10-CM

## 2022-08-09 ENCOUNTER — Other Ambulatory Visit: Payer: Self-pay | Admitting: Family Medicine

## 2022-08-09 DIAGNOSIS — Z1231 Encounter for screening mammogram for malignant neoplasm of breast: Secondary | ICD-10-CM

## 2022-11-10 ENCOUNTER — Ambulatory Visit
Admission: RE | Admit: 2022-11-10 | Discharge: 2022-11-10 | Disposition: A | Discharge status: Home / Self Care | Payer: Medicaid Other | Source: Ambulatory Visit | Attending: Family Medicine | Admitting: Family Medicine

## 2022-11-10 DIAGNOSIS — Z1231 Encounter for screening mammogram for malignant neoplasm of breast: Secondary | ICD-10-CM

## 2023-11-21 ENCOUNTER — Other Ambulatory Visit: Payer: Self-pay | Admitting: Family Medicine

## 2023-11-21 DIAGNOSIS — Z1231 Encounter for screening mammogram for malignant neoplasm of breast: Secondary | ICD-10-CM

## 2023-11-28 ENCOUNTER — Ambulatory Visit
Admission: RE | Admit: 2023-11-28 | Discharge: 2023-11-28 | Disposition: A | Discharge status: Home / Self Care | Source: Ambulatory Visit | Attending: Family Medicine | Admitting: Family Medicine

## 2023-11-28 DIAGNOSIS — Z1231 Encounter for screening mammogram for malignant neoplasm of breast: Secondary | ICD-10-CM
